# Patient Record
Sex: Female | Born: 1935 | Race: White | Hispanic: No | Marital: Married | Smoking: Never smoker
Health system: Southern US, Community
[De-identification: ages and names within clinical notes are randomized; demographics above are authoritative.]

## PROBLEM LIST (undated history)

## (undated) DIAGNOSIS — C4490 Unspecified malignant neoplasm of skin, unspecified: Secondary | ICD-10-CM

## (undated) DIAGNOSIS — F0390 Unspecified dementia without behavioral disturbance: Secondary | ICD-10-CM

## (undated) DIAGNOSIS — I82409 Acute embolism and thrombosis of unspecified deep veins of unspecified lower extremity: Secondary | ICD-10-CM

## (undated) HISTORY — PX: ABDOMINAL HYSTERECTOMY: SHX81

## (undated) HISTORY — PX: APPENDECTOMY: SHX54

---

## 1997-11-30 ENCOUNTER — Ambulatory Visit (HOSPITAL_COMMUNITY): Admission: RE | Admit: 1997-11-30 | Discharge: 1997-11-30 | Payer: Self-pay | Admitting: *Deleted

## 1997-12-20 ENCOUNTER — Ambulatory Visit (HOSPITAL_COMMUNITY): Admission: RE | Admit: 1997-12-20 | Discharge: 1997-12-20 | Payer: Self-pay | Admitting: Internal Medicine

## 1999-01-29 ENCOUNTER — Ambulatory Visit (HOSPITAL_COMMUNITY): Admission: RE | Admit: 1999-01-29 | Discharge: 1999-01-29 | Payer: Self-pay | Admitting: Internal Medicine

## 1999-01-29 ENCOUNTER — Encounter: Payer: Self-pay | Admitting: Internal Medicine

## 1999-08-19 ENCOUNTER — Encounter (INDEPENDENT_AMBULATORY_CARE_PROVIDER_SITE_OTHER): Payer: Self-pay | Admitting: *Deleted

## 1999-08-19 ENCOUNTER — Encounter: Payer: Self-pay | Admitting: Emergency Medicine

## 1999-08-19 ENCOUNTER — Inpatient Hospital Stay (HOSPITAL_COMMUNITY): Admission: EM | Admit: 1999-08-19 | Discharge: 1999-08-21 | Payer: Self-pay | Admitting: Emergency Medicine

## 1999-09-16 ENCOUNTER — Ambulatory Visit (HOSPITAL_COMMUNITY): Admission: RE | Admit: 1999-09-16 | Discharge: 1999-09-16 | Payer: Self-pay | Admitting: Ophthalmology

## 2000-02-09 ENCOUNTER — Encounter: Payer: Self-pay | Admitting: Internal Medicine

## 2000-02-09 ENCOUNTER — Ambulatory Visit (HOSPITAL_COMMUNITY): Admission: RE | Admit: 2000-02-09 | Discharge: 2000-02-09 | Payer: Self-pay | Admitting: Internal Medicine

## 2001-02-28 ENCOUNTER — Encounter: Payer: Self-pay | Admitting: Internal Medicine

## 2001-02-28 ENCOUNTER — Ambulatory Visit (HOSPITAL_COMMUNITY): Admission: RE | Admit: 2001-02-28 | Discharge: 2001-02-28 | Payer: Self-pay | Admitting: Internal Medicine

## 2002-03-08 ENCOUNTER — Encounter: Payer: Self-pay | Admitting: Internal Medicine

## 2002-03-08 ENCOUNTER — Ambulatory Visit (HOSPITAL_COMMUNITY): Admission: RE | Admit: 2002-03-08 | Discharge: 2002-03-08 | Payer: Self-pay | Admitting: Internal Medicine

## 2003-03-14 ENCOUNTER — Ambulatory Visit (HOSPITAL_COMMUNITY): Admission: RE | Admit: 2003-03-14 | Discharge: 2003-03-14 | Payer: Self-pay | Admitting: Internal Medicine

## 2003-03-14 ENCOUNTER — Encounter: Payer: Self-pay | Admitting: Internal Medicine

## 2003-10-17 ENCOUNTER — Ambulatory Visit: Admission: RE | Admit: 2003-10-17 | Discharge: 2003-10-17 | Payer: Self-pay | Admitting: Ophthalmology

## 2003-11-06 ENCOUNTER — Ambulatory Visit (HOSPITAL_COMMUNITY): Admission: RE | Admit: 2003-11-06 | Discharge: 2003-11-06 | Payer: Self-pay | Admitting: Ophthalmology

## 2004-03-25 ENCOUNTER — Ambulatory Visit (HOSPITAL_COMMUNITY): Admission: RE | Admit: 2004-03-25 | Discharge: 2004-03-25 | Payer: Self-pay | Admitting: Internal Medicine

## 2005-04-01 ENCOUNTER — Ambulatory Visit (HOSPITAL_COMMUNITY): Admission: RE | Admit: 2005-04-01 | Discharge: 2005-04-01 | Payer: Self-pay | Admitting: Internal Medicine

## 2006-04-07 ENCOUNTER — Ambulatory Visit (HOSPITAL_COMMUNITY): Admission: RE | Admit: 2006-04-07 | Discharge: 2006-04-07 | Payer: Self-pay | Admitting: Internal Medicine

## 2007-04-12 ENCOUNTER — Ambulatory Visit (HOSPITAL_COMMUNITY): Admission: RE | Admit: 2007-04-12 | Discharge: 2007-04-12 | Payer: Self-pay | Admitting: Internal Medicine

## 2008-04-25 ENCOUNTER — Ambulatory Visit (HOSPITAL_COMMUNITY): Admission: RE | Admit: 2008-04-25 | Discharge: 2008-04-25 | Payer: Self-pay | Admitting: Internal Medicine

## 2009-12-04 ENCOUNTER — Observation Stay (HOSPITAL_COMMUNITY): Admission: EM | Admit: 2009-12-04 | Discharge: 2009-12-05 | Payer: Self-pay | Admitting: Emergency Medicine

## 2009-12-05 ENCOUNTER — Encounter (INDEPENDENT_AMBULATORY_CARE_PROVIDER_SITE_OTHER): Payer: Self-pay | Admitting: Emergency Medicine

## 2009-12-05 ENCOUNTER — Ambulatory Visit: Payer: Self-pay | Admitting: Vascular Surgery

## 2010-03-20 ENCOUNTER — Encounter: Admission: RE | Admit: 2010-03-20 | Discharge: 2010-03-20 | Payer: Self-pay | Admitting: Internal Medicine

## 2010-09-08 LAB — GLUCOSE, CAPILLARY: Glucose-Capillary: 113 mg/dL — ABNORMAL HIGH (ref 70–99)

## 2010-09-08 LAB — CBC
Platelets: 226 10*3/uL (ref 150–400)
RBC: 4.58 MIL/uL (ref 3.87–5.11)
WBC: 7.3 10*3/uL (ref 4.0–10.5)

## 2010-09-08 LAB — DIFFERENTIAL
Basophils Absolute: 0.1 10*3/uL (ref 0.0–0.1)
Eosinophils Absolute: 0.1 10*3/uL (ref 0.0–0.7)
Eosinophils Relative: 1 % (ref 0–5)
Monocytes Absolute: 0.5 10*3/uL (ref 0.1–1.0)

## 2010-09-08 LAB — URINALYSIS, ROUTINE W REFLEX MICROSCOPIC
Bilirubin Urine: NEGATIVE
Glucose, UA: NEGATIVE mg/dL
Hgb urine dipstick: NEGATIVE
Protein, ur: NEGATIVE mg/dL
Specific Gravity, Urine: 1.011 (ref 1.005–1.030)
Urobilinogen, UA: 0.2 mg/dL (ref 0.0–1.0)

## 2010-09-08 LAB — COMPREHENSIVE METABOLIC PANEL
ALT: 13 U/L (ref 0–35)
AST: 19 U/L (ref 0–37)
Albumin: 3.7 g/dL (ref 3.5–5.2)
CO2: 33 mEq/L — ABNORMAL HIGH (ref 19–32)
Chloride: 111 mEq/L (ref 96–112)
GFR calc Af Amer: 59 mL/min — ABNORMAL LOW (ref >60)
GFR calc non Af Amer: 48 mL/min — ABNORMAL LOW (ref >60)
Potassium: 4.2 mEq/L (ref 3.5–5.1)
Sodium: 149 mEq/L — ABNORMAL HIGH (ref 135–145)
Total Bilirubin: 0.7 mg/dL (ref 0.3–1.2)

## 2010-09-08 LAB — APTT: aPTT: 29 seconds (ref 24–37)

## 2010-09-08 LAB — HEMOGLOBIN A1C
Hgb A1c MFr Bld: 5.7 % — ABNORMAL HIGH (ref <5.7)
Mean Plasma Glucose: 117 mg/dL — ABNORMAL HIGH (ref <117)

## 2010-09-08 LAB — LIPID PANEL
LDL Cholesterol: 147 mg/dL — ABNORMAL HIGH (ref 0–99)
Total CHOL/HDL Ratio: 4 RATIO
VLDL: 22 mg/dL (ref 0–40)

## 2010-09-08 LAB — PROTIME-INR: Prothrombin Time: 13.2 seconds (ref 11.6–15.2)

## 2010-09-08 LAB — CK TOTAL AND CKMB (NOT AT ARMC)
CK, MB: 1.8 ng/mL (ref 0.3–4.0)
Total CK: 116 U/L (ref 7–177)

## 2010-11-07 NOTE — H&P (Signed)
[place]. [facility]  Patient:    Jill Watkins, Jill Watkins                          MRN: [id number] Adm. Date:  [id number] Attending:  Vashti Hey                         History and Physical  DATE OF BIRTH:  16-Jul-1923.  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  Because of severity of pain and the fact that she was quite nauseated, she presented to the emergency room at 0200 hours and is evaluated by the emergency room staff.  The evaluation disclosed probable pancreatitis and she was subsequently referred to this examiner for further evaluation and subsequent admission.  The patient has no previous history of pancreatic disorder.  She has no previous history of gallbladder disorders.  She does not come to the office on a regular  basis.  Her last visit was July of 1998 and she was seen by one of our staff for complaints of nonspecific abdominal pain which was also radiating into the back. She had some loose stools, but not diarrhea on that presentation.  She had had bloating of the abdomen at that time.  The patient does have a history of nonbleeding peptic ulcer disease some years back, but no problems recently. She was also evaluated for severe epigastric pain in December of 1996 at which time she had "unbearable" epigastric pain radiating to her back and burning under the breasts.  She was prescribed some Carafate which helped quite a bit and she did not require further attention at that time.  She underwent an ultrasound of her abdomen in December of 1996 and at that time the gallbladder was well visualized without evidence of stones.  At that time  the common bile duct was normal measuring 3 mm. The liver, pancreas, and kidneys were all within normal limits.  The aorta was normal measuring up to 2 cm.  With the present illness, the patient denies any fever, chills, sweats, or actual vomiting.  She has had normal bowel movements up until this illness.  She has had no dysuria or hematuria.  PAST MEDICAL HISTORY:  Appendectomy at age 34.  Hysterectomy with retained ovaries at age 35.  She was hospitalized for ulcer disease about 40 years ago.  She also underwent excision of a melanoma on her left forearm about 40 years ago.  MEDICATIONS:  Citrucel for her bowels occasionally.  FAMILY HISTORY:  Both parents deceased at age 47.  Father of old age.  Mother had probable bowel obstruction.  She had had her gallbladder out prior to her death. She also had a total knee and total hip operation.  Her father had had a hip operation when he was living.  She had two brothers and two sisters, all are deceased.  One brother died at age 95 of cancer of the small intestines.  Other  brother died at age 40.  He had had a stroke, heart trouble, and was a diabetic. The brother deceased at age 83 had also had an MI at  age 59.  One sister died at age 39 of pneumonia.  Another sister died of seizures at age 35-1/2.  Maternal grandfather died of cancer of some sort.  The patient has one son and one daughter. She has two grandchildren by her son.  The grandchildren are ages 28 and 38.  REVIEW OF SYSTEMS:  HEENT: The patient does have cataracts and is due to have surgery on March 27, by Jill Watkins, M.D.  She had recently called to schedule a preoperative assessment, but never got to the office.  No complaints of ears or throat.  No significant headaches.  No loss of consciousness. CARDIOVASCULAR:  Denies any chest pain or shortness of breath.  No recent problems with any respiratory illnesses.  No history of heart disease.  She does have  a history of high cholesterol.  GASTROINTESTINAL: No regular problems with her bowels. No nausea and vomiting.  No history of colitis, pancreatitis, or gallbladder disease.  GENITOURINARY: She has had urinary tract infections in the past, but nothing recent.  No hematuria or dysuria.  ENDOCRINOLOGIC: No history of diabetes or thyroid disorders.  HEMATOLOGIC:  No bleeding or bruising tendencies. MUSCULOSKELETAL: Some stiffness in various joints and muscles, but nothing specific requiring regular treatment.  NEUROPSYCHIATRIC: Denies any unusual nervous conditions.  ALLERGIES:  CODEINE makes her nauseated and vomit.  ERYTHROMYCIN upsets her stomach.  SOCIAL HISTORY:  She is wife of Fayrene Fearing "Cindee [employer]ame[employer], a Printmaker. The patient does not use alcohol in any form.  She does not use tobacco in any form. She does household activities only.  PHYSICAL EXAMINATION:  VITAL SIGNS:  On presentation notes a temperature of 99.3 degrees orally, blood  pressure 136/88, pulse 72, respirations 20.  Follow-up blood pressures and pulses were similar.  HEENT:  Pupils minimally reactive to light and accommodation directly consentually. Extraocular muscles are intact.  No signs of scleral icteris.  Oral cavity notes the membranes to be quite dry.  There is a moderate fetor due to her lack of hydration.  Teeth are in reasonably good repair.  Membranes are otherwise negative. Ear canals are patent.  Skull with no signs of trauma.  NECK:  Supple.  No thyromegaly or adenopathy.  LUNGS:  Clear anterior and laterally.  HEART:  Regular rate and rhythm without murmurs, rubs, or gallops.  ABDOMEN:  Hypoactive bowel sounds.  There is diffuse tenderness especially in the periumbilical area and lower quadrants, right greater than left.  PELVIC: RECTAL:  Deferred at this time.  EXTREMITIES:  Show no edema, cyanosis, or clubbing.  Good range of motion. Pulses are intact.  NEUROLOGICAL:   Grossly intact.  No gross signs of cranial nerve deficits.  She moves all limbs well and has good muscle tone.   LABORATORY DATA:  Urinalysis shows specific gravity 1.014, otherwise negative. er comprehensive metabolic panel is unremarkable with normal liver functions and a BUN of 19, creatinine 0.8.  Her CBC shows elevated white count of 15,500, hemoglobin 13.5, hematocrit 41%, MCV 82, platelet count 260,000.  Her amylase level is normal at 120, upper normal of 131.  Her lipase is normal at 40, upper normal at 51.  CT scan of her abdomen with contrast media showed a mild enlargement and inhomogenedia of the pancreas particularly in the region of the head.  Findings are consistent with acute pancreatitis.  There is no evidence of gallstones or biliary ductal dilatation.  CT of the pelvis showed bladder to be mildly distended, but no  free fluid was seen.  No inguinal or iliac adenopathy.  There was mild changes f sigmoid diverticulosis.  IMPRESSION: 1. Acute abdominal pain with possible passage of gallstone and subsequent mild    pancreatitis, though, no evident clinically with elevated amylase or lipase. 2. Status post melanoma excision left forearm. 3. Status post hysterectomy. 4. Cataracts with upcoming eye surgery.  PLAN:  The patient is placed NPO at the present time and is placed on intravenous fluids.  She will have a two-hour urinary amylase to further assess the pancreatitis.  She will have follow-up CBC soon.  I have asked Sabino Gasser, M.D., gastroenterologist with [facility] to evaluate her for any further diagnostic procedures.  Condition at this time is fair.  Prognosis is fair to good. DD:  08/19/99 TD:  08/19/99 Job: [id number]

## 2010-11-07 NOTE — Discharge Summary (Signed)
[place]. [facility]  Patient:    Jill Watkins, Jill Watkins                          MRN: [id number] Adm. Date:  [id number] Disc. Date: [id number] Attending:  Vashti Hey Dictator:   Janae Bridgeman Eloise Harman., M.D.                           Discharge Summary  DISCHARGE DIAGNOSES: 1. Multiple duodenal ulcers. 2. Acute pancreatitis secondary to above.  HISTORY OF PRESENT ILLNESS:  This 77 year old married white female presented with acute severe abdominal and back pain, and was found to have acute pancreatitis n CT scanning of her abdomen.  She was subsequently admitted for further evaluation and treatment of pancreatitis with further diagnostic procedures entertained.  HOSPITAL COURSE:  The patient was seen by specialist, Dr. Sabino Gasser, for gastroenterology service.  He concurred with the indications for upper endoscopy to further define the abdominal pain.  The patient had serum enzymes for pancreatitis, and they were surprisingly normal.  She had a two hour urinary amylase which was normal.  Her hemoglobin was stable.  There was no evidence of bleeding.  Upper endoscopy by Dr. Virginia Rochester on 08/20/99 disclosed multiple duodenal ulcers.  CLOtesting was performed, but report is not available at this time.  The patient had rapid resolution of her symptoms following intravenous Pepcid, nd she was subsequently switched to Protonix.  She was kept n.p.o. for the first 48 hours.  LABORATORY DATA:  Her timed urinary amylase was 12.8 iu/hr with upper normal of  25.2.  Her triglycerides were normal at 53.  Her urine culture showed no growth. Her Helicobacter screen is pending.  Serum amylase normal at 73.  Comprehensive  metabolic panel was normal with a slightly elevated glucose of 119 with an IV running.  Total bilirubin was slightly up at 1.3.  Her lipase was low at 21. Her CBC showed initial white count of 15,500, and then later in the same day it  was  16,300, and subsequently down to 11,000.  Hemoglobin 12.4, hematocrit 38% on specimen of 08/20/99.  Her lipid profile showed a cholesterol of 191, triglycerides 87, HDL cholesterol 62, LDL of 112.  Her three-way abdomen was unremarkable. There were slight increase marking in the lungs bilaterally, moderate amount of feces  noted.  A CT scan of the abdomen was verbally reported as showing pancreatitis ith edema of the head of the pancreas.  No other abnormalities were seen.  PROCEDURES:  Endoscopy with biopsy by Dr. Sabino Gasser.  TRANSFUSIONS:  None.  INJECTIONS:  None.  RECOMMENDATIONS:  On discharge the patient is advised that she had duodenal ulcers and this could well be due to a germ called Helicobacter.  She was to take Protonix 40 mg b.i.d. for the first 2 weeks, then 40 mg daily x 2 additional weeks. Pending the results of her biopsy, she will likely need some amoxicillin 500 mg t.i.d. before meals for two full weeks, but she will hold this prescription for now. he will avoid any kind of aspirin or ibuprofen products.  She will see me in the office in one month, but call for any additional pain.  She may proceed with her cataract surgery scheduled for later this month by Dr. Stormy Fabian.  CONDITION ON DISCHARGE:  Greatly improved. DD:  08/21/99 TD:  08/21/99 Job: [id number]

## 2010-11-07 NOTE — H&P (Signed)
NAME:  Jill Watkins, Jill Watkins                             ACCOUNT NO.:  [id number]   MEDICAL RECORD NO.:  [id number]                   PATIENT TYPE:  OIB   LOCATION:  2899                                 FACILITY:  [facility]   PHYSICIAN:  Guadelupe Sabin, M.D.             DATE OF BIRTH:  Nov 17, 1923   DATE OF ADMISSION:  11/06/2003  DATE OF DISCHARGE:                                HISTORY & PHYSICAL   This was a planned outpatient surgical readmission of this 77 year old white  female admitted for cataract implant surgery of the right eye.   HISTORY OF PRESENT ILLNESS:  This patient has a long history of eye trouble  dating back to May 30, 1996, when she had a retinal detachment.  She was  admitted to this hospital and had a scleral buckling procedure performed.  On August 22, 1996, a pars plana vitrectomy was performed on the same left  eye.  More recently, the patient has undergone cataract implant surgery of  the left eye on September 16, 1999.  The patient has done well following this  surgical procedure on the left eye with return of vision to 20/60.  The  unoperated right eye has deteriorated to 20/16, and the patient has noted  blurred, smoky, and dim vision; difficulty with driving; seeing road signs;  difficulty with television; reading; night vision; and bright sunlight  vision.  She has had difficulty seeing housework and trouble with depth  perception difficulties. She has signed an informed consent and arrangements  made for outpatient admission at this time.   PAST MEDICAL HISTORY:  The patient is in stable general health under the  care of Dr. Dimas Alexandria.  She is felt to be in satisfactory condition  for the proposed surgery.   MEDICATIONS:  The patient takes calcium and Tylenol medications.   ALLERGIES:  She is said to be allergic to CODEINE and ASPIRIN.   PHYSICAL EXAMINATION:  VITAL SIGNS:  As recorded on admission, blood  pressure 132/82, pulse 84, respirations 18.  GENERAL:  The patient is a pleasant, well-nourished, well-developed white  female in no acute distress.  HEENT:  Eyes:  On ocular exam, the eyes are white and clear with a clear  cornea, deep and clear anterior chamber.  Nuclear cataract formation is  present in the right eye and a posterior chamber intraocular lens implant in  the left eye.  Applanation tonometry 18 mm each eye.  Detailed fundus  examination dilated reveals cataract haze in the right eye.  The retina is  attached with normal optic nerve, blood vessels, and macula.  The left eye  shows a clear view with reattached retina with a temporal scleral buckling  indentation.  The optic nerve is sharply outlined, of good color, disk/cup  ratio 0.4.  CHEST:  Lungs clear to percussion and auscultation.  HEART:  Normal sinus rhythm.  No cardiomegaly, no  murmurs.  ABDOMEN:  Negative.  EXTREMITIES:  Negative.   ADMISSION DIAGNOSES:  1. Senile nuclear cataract, right eye.  2. Pseudophakia, left eye.   SURGICAL PLAN:  Cataract implant surgery, right eye.                                                Guadelupe Sabin, M.D.    HNJ/MEDQ  D:  11/06/2003  T:  11/06/2003  Job:  [id number]   cc:   Marisa Cyphers, M.D  Please include associated laboratory  data, EKG, or chest x-ray material.

## 2010-11-07 NOTE — H&P (Signed)
[place]. [facility]  Patient:    Jill Watkins, Jill Watkins                          MRN: [id number] Adm. Date:  [id number] Attending:  Ivor Messier CC:         Janae Bridgeman. Eloise Harman., M.D.                         History and Physical  CHIEF COMPLAINT:  This was a planned outpatient surgical admission of this 77 year old white female, admitted for cataract implant surgery of the left eye.  HISTORY OF PRESENT ILLNESS: This patient has been followed in my office since 1997 when she was noted to have a retinal detachment of the left eye.  A scleral buckling procedure was performed with intravitreal.  A secondary vitrectomy was  performed on August 22, 1996.  The patient achieved retinal reattachment and return of good vision in the eye after healing.  Recently, however, the 20/30 vision has deteriorated to 20/200 due to progressive cataract formation.  She has now elected to proceed with cataract implant surgery of the left eye.  The patient signed an informed consent and arrangements were made for her outpatient admission at this time.  PAST MEDICAL HISTORY: The patient is in stable general health, under the care of her regular physician, Dr. Higinio Plan.  MEDICATIONS:  The patient takes medication for stomach ulcers.  REVIEW OF SYSTEMS:  No cardiorespiratory complaints.  PHYSICAL EXAMINATION:  GENERAL:  The patient is a pleasant well-developed, well-nourished white female in no acute ocular distress.  HEENT:  Eyes: Visual acuity recently recorded with best correction at 20/50 right eye, 20/100 left eye.  Applanation tenometry normal; right eye 14 mm, left eye 4 mm.  Slit lamp examination revealed the eyes are white and clear with nuclear cataract formation in both eyes, greater in the left than right eye.  Fundus examination reveals a clear vitreous, attached retina.  An old scleral buckling  indentation is seen on the temporal side of the  left eye.  The optic nerve is sharply outlined.  Good color.  Disc cup ratio approximately 0.3.  CHEST:  Lungs clear to auscultation and percussion.  HEART:  Normal sinus rhythm, no cardiomegaly, no murmurs.  ABDOMEN:  Negative.  EXTREMITIES:  Negative.  ADMISSION DIAGNOSIS: Senile cataract, left eye, status post previous scleral buckling vitrectomy surgery left eye.  SURGICAL PLAN:  Planned extracapsular cataract extraction with primary insertion of posterior chamber intraocular lens implant, left eye. DD:  09/16/99 TD:  09/16/99 Job: [id number]

## 2010-11-07 NOTE — Op Note (Signed)
NAME:  Jill Watkins, Jill Watkins                             ACCOUNT NO.:  [id number]   MEDICAL RECORD NO.:  [id number]                   PATIENT TYPE:  OIB   LOCATION:  2899                                 FACILITY:  [facility]   PHYSICIAN:  Guadelupe Sabin, M.D.             DATE OF BIRTH:  1923/10/30   DATE OF PROCEDURE:  11/06/2003  DATE OF DISCHARGE:                                 OPERATIVE REPORT   PREOPERATIVE DIAGNOSIS:  Senile nuclear cataract, right eye.   POSTOPERATIVE DIAGNOSES:  Senile nuclear cataract, right eye.   OPERATION:  Planned extracapsular cataract extraction --  phacoemulsification, primary insertion of posterior chamber intraocular lens  implant.   SURGEON:  Guadelupe Sabin, M.D.   ASSISTANT:  Nurse.   ANESTHESIA:  Local 4% Xylocaine, 0.75 Marcaine, retrobulbar block with  Wydase added, topical tetracaine, intraocular Xylocaine.  Anesthesia standby  required.  Patient given sodium Pentothal or Ditropan intravenously during  the period of retrobulbar injection.   DESCRIPTION OF PROCEDURE:  After the patient was prepped and draped, a lid  speculum was inserted in the right eye.  The eye was turned downward and a  superior rectus suture placed.  A peritomy was performed adjacent to the  limbus from the 11 to 1 o'clock position.  The corneoscleral junction was  cleaned and a corneoscleral groove made with a 45 degree Superblade.  The  anterior chamber was then entered with the 2.5 mm diamond keratome at the 12  o'clock position and the 15 degree blade at the 2:30 position.  Using a bent  26-gauge needle on a Occucoat syringe, a circular capsulorhexis was begun  and then completed with the Grabow forceps.  Hydrodissection and  hydrodelineation were performed using 1% Xylocaine.  The 30-degree  phacoemulsification tip was inserted with slow, controlled emulsification  with lens nucleus.  Total ultrasonic time: 1 minute 2 seconds.  Average  power level: 16%.  Total amount  of fluid used: 50 ml.  Following removal of  the nucleus, the residual cortex was aspirated with the irrigation  aspiration tip.  The posterior capsule appeared intact.  It was, therefore,  elected to insert an [employer] Opt[employer] [id number] silicone three-piece  posterior chamber implant, diopter strength +25.00.  This was inserted with  the McDonald forceps into the anterior chamber.  It was realized, however,  that the haptic loop was broken, and the implant was then removed after the  incision had been extended to approximately 6 mm.  A new similar implant was  then inserted into the anterior chamber and then centered into the capsular  bag.  The operative incision, which  had been extended to 6 mm, was then closed with three 10-0 interrupted nylon  sutures.  Maxitrol ointment was instilled in the conjunctival cul-de-sac and  a  light patch applied.  Duration of procedure: 45 minutes.  The patient  tolerated the procedure  well in general and left the operating room for the  recovery room in good condition.                                               Guadelupe Sabin, M.D.    HNJ/MEDQ  D:  11/06/2003  T:  11/06/2003  Job:  [id number]   cc:   Janae Bridgeman. Eloise Harman., M.D.  [address]  [place]  [place] [address]  Fax: [phone]

## 2010-11-07 NOTE — Procedures (Signed)
[place]. [facility]  Patient:    Jill Watkins, Jill Watkins                          MRN: [id number] Adm. Date:  [id number] Attending:  Vashti Hey                           Procedure Report  PROCEDURE:  Endoscopy with biopsy.  ENDOSCOPIST:  Sabino Gasser, M.D.  INDICATIONS:  Abdominal pain.  ANESTHESIA:  Demerol 30 mg and Versed 4 mg were given intravenously in divided dose.  DESCRIPTION OF PROCEDURE:  With patient mildly sedated in the left lateral decubitus position, the Olympus videoscopic endoscope was inserted into the mouth and passed under direct vision through the esophagus, which appeared normal.  A  small hiatal hernia was visualized and photographed.  We entered into the stomach. The antrum was visualized and appeared normal.  As we went through the duodenal  bulb, anteriorly it appeared normal, as did the second portion of duodenum.  As we pulled back, taking circumferential views of the entire duodenal mucosa, we noted an ulceration and subsequently noticed that there were a number of small ulcers  posteriorly and superiorly in the duodenal bulb with attendant edema which was fairly marked and fairly localized.  We biopsied this after photographing it and subsequently pulled the endoscope back into the stomach and took a CLO biopsy. The antrum appeared slightly erythematous.  We placed the endoscope in retroflexion to view the stomach from below and the hiatal hernia was once again visualized and  photographed.  The endoscope was then straightened and pulled back from distal-to-proximal stomach, taking circumferential views of the entire gastric nd subsequently the esophageal mucosa which otherwise appeared normal.  Patients vital signs and pulse oximetry remained stable.  Patient tolerated procedure well without apparent complications.  FINDINGS:  Hiatal hernia; posterior duodenal bulb ulcerations, biopsied, CLO biopsy taken  as well.  IMPRESSION:  Posterior duodenal ulcers in the bulb which I think would account or the patients right upper quadrant tenderness and for the mild inflammatory changes seen in the head of the pancreas, with normal amylase and lipase.  At this point, will advance diet as tolerated, change patient to proton pump inhibitor therapy, await biopsy reports to institute Helicobacter pylori therapy if positive. DD:  08/20/99 TD:  08/20/99 Job: [id number]

## 2010-11-07 NOTE — Op Note (Signed)
[place]. [facility]  Patient:    Jill Watkins, Jill Watkins                          MRN: [id number] Proc. Date: 09/16/99 Adm. Date:  [id number] Attending:  Ivor Messier                           Operative Report  PREOPERATIVE DIAGNOSIS: Senile cataract, left eye.  POSTOPERATIVE DIAGNOSIS: Senile cataract, left eye.  OPERATION/PROCEDURE: Planned extracapsular cataract extraction, phacoemulsification, primary insertion of posterior chamber intraocular lens implant.  SURGEON:  Guadelupe Sabin, M.D.  ASSISTANT:  Nurse.  ANESTHESIA: Local 4% xylocaine, 0.75% Marcaine retrobulbar block, topical tetracaine, intraocular xylocaine.  DESCRIPTION OF PROCEDURE: After the patient was prepped and draped a speculum was inserted in the left eye.  The eye was turned downward and a superior rectus traction suture placed.  Schiotz tenometry was recorded at 7 scale units with a 5.5 gram weight.  A peritomy was performed adjacent to the limbus from the 11 to 1 oclock position.  The corneoscleral junction was cleaned and a corneoscleral groove made with a 45 degree Superblade.  The anterior chamber was then entered  with a 2.5 mm diamond keratome at the 12 oclock position and a 15 degree blade t the 2:30 oclock position.  Using a bent 26 gauge needle on a Healon syringe a circular capsulorrhexis was begun and then completed with the Grabo forceps. Hydrodissection and hydrodelineation were performed using 1% xylocaine.  The 30  degree phacoemulsification tip was then inserted with slow emulsification of the lens nucleus.  Total ultrasonic time was one minute and 12 seconds, average power level 16%, total amount of fluid used during the emulsification 45 cc. Following removal of the nucleus the residual cortex was aspirated with the 0.3 mm irrigation-aspiration tip.  The posterior capsule appeared intact with a brilliant red fundus reflex.  It was therefore elected to  insert an [id number] silicone posterior chamber intraocular lens implant with UV absorber.  Diopter strength +22.00. This was inserted with the McDonald forceps and then placed in the capsular bag using the Lister lens rotator.  The lens appeared to be well centered.  The Healon which had been used during the procedure was then aspirated and replaced with balanced salt solution with Miochol ophthalmic solution to stimulate pupillary constriction. The operative incision was tested and found to be self-sealing.  No sutures were required.  The conjunctiva was brushed forward over the operative incision and Eserine ophthalmic ointment was instilled in the conjunctival cul-de-sac, and a  light patch and protective shield applied.  Duration of procedure and anesthesia administration 45 minutes.  The patient tolerated the procedure well in general and left the operating room for the recovery room in good condition. DD:  09/16/99 TD:  09/16/99 Job: [id number]

## 2011-05-30 ENCOUNTER — Emergency Department (HOSPITAL_COMMUNITY)
Admission: EM | Admit: 2011-05-30 | Discharge: 2011-05-30 | Discharge status: Against Medical Advice | Payer: Medicare Other | Attending: Emergency Medicine | Admitting: Emergency Medicine

## 2011-05-30 ENCOUNTER — Encounter: Payer: Self-pay | Admitting: Emergency Medicine

## 2011-05-30 DIAGNOSIS — S8990XA Unspecified injury of unspecified lower leg, initial encounter: Secondary | ICD-10-CM | POA: Insufficient documentation

## 2011-05-30 DIAGNOSIS — W1789XA Other fall from one level to another, initial encounter: Secondary | ICD-10-CM | POA: Insufficient documentation

## 2011-05-30 DIAGNOSIS — Z5320 Procedure and treatment not carried out because of patient's decision for unspecified reasons: Secondary | ICD-10-CM | POA: Insufficient documentation

## 2011-05-30 HISTORY — DX: Unspecified malignant neoplasm of skin, unspecified: C44.90

## 2011-05-30 NOTE — ED Notes (Signed)
Pt reports fall today down front steps. Pt hit right leg on side of steps. Pt has lac to right leg and swelling noted.

## 2011-06-22 ENCOUNTER — Encounter (HOSPITAL_COMMUNITY): Payer: Self-pay | Admitting: Nurse Practitioner

## 2011-06-22 ENCOUNTER — Emergency Department (HOSPITAL_COMMUNITY)
Admission: EM | Admit: 2011-06-22 | Discharge: 2011-06-22 | Discharge status: Against Medical Advice | Payer: Medicare Other | Attending: Emergency Medicine | Admitting: Emergency Medicine

## 2011-06-22 DIAGNOSIS — M79609 Pain in unspecified limb: Secondary | ICD-10-CM | POA: Insufficient documentation

## 2011-06-22 HISTORY — DX: Unspecified dementia, unspecified severity, without behavioral disturbance, psychotic disturbance, mood disturbance, and anxiety: F03.90

## 2011-06-22 HISTORY — DX: Acute embolism and thrombosis of unspecified deep veins of unspecified lower extremity: I82.409

## 2011-06-22 NOTE — ED Notes (Signed)
Pt fell approx 3 weeks ago and injured RLE. After the fall developed infection in RLE at site of injury, started on cephalexin by PCP but d/t pt dementia she did not complete abx or take them correctly and daughter feels infection remains. Daughter states xrays were completed at initial visit and no broken bones. Pt c/o R calf pain and pain at site. Scab with swelling and bruising to R anterior calf

## 2012-08-26 ENCOUNTER — Encounter (HOSPITAL_COMMUNITY): Payer: Self-pay | Admitting: *Deleted

## 2012-08-26 ENCOUNTER — Emergency Department (HOSPITAL_COMMUNITY)
Admission: EM | Admit: 2012-08-26 | Discharge: 2012-08-26 | Disposition: A | Discharge status: Home / Self Care | Payer: Medicare Other | Attending: Emergency Medicine | Admitting: Emergency Medicine

## 2012-08-26 DIAGNOSIS — G309 Alzheimer's disease, unspecified: Secondary | ICD-10-CM | POA: Insufficient documentation

## 2012-08-26 DIAGNOSIS — IMO0001 Reserved for inherently not codable concepts without codable children: Secondary | ICD-10-CM | POA: Insufficient documentation

## 2012-08-26 DIAGNOSIS — Z86718 Personal history of other venous thrombosis and embolism: Secondary | ICD-10-CM | POA: Insufficient documentation

## 2012-08-26 DIAGNOSIS — Z79899 Other long term (current) drug therapy: Secondary | ICD-10-CM | POA: Insufficient documentation

## 2012-08-26 DIAGNOSIS — F0280 Dementia in other diseases classified elsewhere without behavioral disturbance: Secondary | ICD-10-CM | POA: Insufficient documentation

## 2012-08-26 DIAGNOSIS — Z85828 Personal history of other malignant neoplasm of skin: Secondary | ICD-10-CM | POA: Insufficient documentation

## 2012-08-26 DIAGNOSIS — M791 Myalgia, unspecified site: Secondary | ICD-10-CM

## 2012-08-26 DIAGNOSIS — R5381 Other malaise: Secondary | ICD-10-CM | POA: Insufficient documentation

## 2012-08-26 DIAGNOSIS — R7989 Other specified abnormal findings of blood chemistry: Secondary | ICD-10-CM | POA: Insufficient documentation

## 2012-08-26 DIAGNOSIS — R51 Headache: Secondary | ICD-10-CM | POA: Insufficient documentation

## 2012-08-26 LAB — URINALYSIS, ROUTINE W REFLEX MICROSCOPIC
Bilirubin Urine: NEGATIVE
Nitrite: NEGATIVE
Specific Gravity, Urine: 1.016 (ref 1.005–1.030)
Urobilinogen, UA: 1 mg/dL (ref 0.0–1.0)
pH: 6.5 (ref 5.0–8.0)

## 2012-08-26 LAB — URINE MICROSCOPIC-ADD ON

## 2012-08-26 LAB — POCT I-STAT, CHEM 8
Calcium, Ion: 1.16 mmol/L (ref 1.13–1.30)
Creatinine, Ser: 1.2 mg/dL — ABNORMAL HIGH (ref 0.50–1.10)
Hemoglobin: 13.6 g/dL (ref 12.0–15.0)
Sodium: 145 mEq/L (ref 135–145)
TCO2: 31 mmol/L (ref 0–100)

## 2012-08-26 NOTE — ED Notes (Signed)
EKG completed and given to Dr. Bednar along with OLD ekg. 

## 2012-08-26 NOTE — ED Provider Notes (Signed)
History     CSN: [id number]  Arrival date & time 08/26/12  1040   None     Chief Complaint  Patient presents with  . Headache  . Muscle Pain    all over body aches    (Consider location/radiation/quality/duration/timing/severity/associated sxs/prior treatment) Patient is a 79 y.o. female presenting with headaches and musculoskeletal pain.  Headache Associated symptoms: no abdominal pain, no cough, no diarrhea, no fever and no vomiting   Muscle Pain Associated symptoms include headaches. Pertinent negatives include no abdominal pain, chest pain, chills, coughing, fever, vomiting or weakness.   Ms. Joa 78yo woman pmh [place] dementia presented to ED with son and husband (who is primary caregiver) since pt described feeling "bad all over." Pt is able to participate in conversation and states not in active pain at this time. Family states pt has not had any fever/chills, nausea/vomiting/diarrhea, cough or SOB. No other sick contacts. Pt ambulates at home and is able to participate in ADLs. Primary caregiver states has been taking all medication and pt usually a little more subdued after "anxiety pills." Family members denied any slurred speech, new weakness, LOC, no facial assymmetry.    Past Medical History  Diagnosis Date  . Skin cancer   . Dementia   . DVT (deep venous thrombosis)     Past Surgical History  Procedure Laterality Date  . Appendectomy    . Abdominal hysterectomy      History reviewed. No pertinent family history.  History  Substance Use Topics  . Smoking status: Never Smoker   . Smokeless tobacco: Not on file  . Alcohol Use: No    OB History   Grav Para Term Preterm Abortions TAB SAB Ect Mult Living                  Review of Systems  Constitutional: Negative for fever, chills, activity change and appetite change.  Respiratory: Negative for cough and shortness of breath.   Cardiovascular: Negative for chest pain.  Gastrointestinal: Negative for  vomiting, abdominal pain, diarrhea and constipation.  Genitourinary: Negative for difficulty urinating.  Neurological: Positive for headaches. Negative for syncope, facial asymmetry, speech difficulty and weakness.    Allergies  Codeine  Home Medications   Current Outpatient Rx  Name  Route  Sig  Dispense  Refill  . diphenhydramine-acetaminophen (TYLENOL PM) 25-500 MG TABS   Oral   Take 1 tablet by mouth at bedtime as needed.         . divalproex (DEPAKOTE) 250 MG DR tablet   Oral   Take 250 mg by mouth 3 (three) times daily.         Marland Kitchen LORazepam (ATIVAN) 1 MG tablet   Oral   Take 1 mg by mouth daily.         . Memantine HCl ER (NAMENDA XR) 28 MG CP24   Oral   Take 1 capsule by mouth daily.         . valsartan-hydrochlorothiazide (DIOVAN-HCT) 160-25 MG per tablet   Oral   Take 1 tablet by mouth daily. Take one-half tab to one tablet once a day.         . cephALEXin (KEFLEX) 500 MG capsule   Oral   Take 500 mg by mouth every 6 (six) hours.             BP 116/94  Temp(Src) 97.5 F (36.4 C) (Oral)  Resp 16  SpO2 95%  Physical Exam  Constitutional: She appears well-developed  and well-nourished. No distress.  HENT:  Head: Normocephalic.  Eyes: EOM are normal. Pupils are equal, round, and reactive to light.  Cardiovascular: Normal rate and regular rhythm.  Exam reveals no gallop.   No murmur heard. Pulmonary/Chest: Effort normal and breath sounds normal. She has no wheezes. She has no rales.  Abdominal: Soft. Bowel sounds are normal. She exhibits no distension and no mass. There is no tenderness. There is no rebound and no guarding.  Neurological: She is alert. No cranial nerve deficit.  Oriented to self and place  Skin: Skin is warm.  Psychiatric:  Appropriate thought content, decreased judgement, and intermittent short term memory loss    ED Course  Procedures (including critical care time)  Labs Reviewed  POCT I-STAT, CHEM 8 - Abnormal; Notable  for the following:    BUN 26 (*)    Creatinine, Ser 1.20 (*)    All other components within normal limits  URINALYSIS, ROUTINE W REFLEX MICROSCOPIC   No results found.   No diagnosis found.    MDM  Overall weakness: Pt had no localizing neurological findings and pt had no other signs of being in pain (tachycardia, HTN, etc) or other localizing physical exam findings. Labs revealed slight prerenal azotemia, EKG NSR, and UA was still pending.   Pt was seen and discussed with Dr. Linna Darner, MD 08/26/12 1153

## 2012-08-26 NOTE — ED Provider Notes (Signed)
I saw and evaluated the patient, reviewed the resident's note and I agree with the findings and plan.   .Face to face Exam:  General:  Awake HEENT:  Atraumatic Resp:  Normal effort Abd:  Nondistended Neuro:No focal weakness Lymph: No adenopathy   Nelia Shi, MD 08/26/12 1356

## 2012-08-26 NOTE — ED Notes (Signed)
Pt presents with report of generalized pain that husband reports pt began to complain about this morning.  Pt denies any pain at present.  Husband reports pt's mental status is at baseline.

## 2012-08-26 NOTE — ED Notes (Signed)
Pt found, fully clothed with coat on, walking down the hall.  Husband and son remained in her room, pt reports "I'm ready to go, I've been here all day".  Pt gently escorted back to her room and informed that the wait is for her urine to result.

## 2012-08-26 NOTE — ED Notes (Signed)
Son reports pt is pulling off blood pressure cuff and EKG wires.  Husband reports he can take pt to bathroom for urine specimen.  Clean catch instructions given.

## 2012-08-26 NOTE — ED Notes (Signed)
To ED for eval of HA and 'hurting all over' since waking this am. No vomiting. No trauma noted.

## 2012-08-26 NOTE — ED Notes (Signed)
Pt walking down E.D. Hallways with son, insisting on leaving.  Pt escorted back to room, discharge instructions given to husband.

## 2013-01-08 ENCOUNTER — Ambulatory Visit (HOSPITAL_COMMUNITY)
Admission: RE | Admit: 2013-01-08 | Discharge: 2013-01-08 | Disposition: A | Discharge status: Home / Self Care | Payer: Medicare Other | Source: Ambulatory Visit | Attending: Family Medicine | Admitting: Family Medicine

## 2013-01-08 ENCOUNTER — Ambulatory Visit (HOSPITAL_COMMUNITY): Admission: RE | Admit: 2013-01-08 | Payer: Medicare Other | Source: Ambulatory Visit

## 2013-01-08 ENCOUNTER — Ambulatory Visit (INDEPENDENT_AMBULATORY_CARE_PROVIDER_SITE_OTHER): Payer: Medicare Other | Admitting: Family Medicine

## 2013-01-08 ENCOUNTER — Ambulatory Visit: Payer: Medicare Other

## 2013-01-08 ENCOUNTER — Other Ambulatory Visit (HOSPITAL_COMMUNITY): Payer: Medicare Other

## 2013-01-08 VITALS — BP 120/70 | HR 79 | Temp 98.0°F | Resp 16 | Ht 63.0 in | Wt 155.0 lb

## 2013-01-08 DIAGNOSIS — G309 Alzheimer's disease, unspecified: Secondary | ICD-10-CM

## 2013-01-08 DIAGNOSIS — F0280 Dementia in other diseases classified elsewhere without behavioral disturbance: Secondary | ICD-10-CM

## 2013-01-08 DIAGNOSIS — I10 Essential (primary) hypertension: Secondary | ICD-10-CM

## 2013-01-08 DIAGNOSIS — M1611 Unilateral primary osteoarthritis, right hip: Secondary | ICD-10-CM

## 2013-01-08 DIAGNOSIS — M25551 Pain in right hip: Secondary | ICD-10-CM

## 2013-01-08 DIAGNOSIS — M25559 Pain in unspecified hip: Secondary | ICD-10-CM

## 2013-01-08 DIAGNOSIS — M169 Osteoarthritis of hip, unspecified: Secondary | ICD-10-CM

## 2013-01-08 MED ORDER — PREDNISONE 20 MG PO TABS: ORAL_TABLET | ORAL | Status: DC

## 2013-01-08 NOTE — Progress Notes (Signed)
This is a pleasant 79 year old woman who comes in with right hip pain. She lives with her husband, and her daughter from out of town brought her by this morning.  History is that the patient was unplugging her dryer 3 nights ago to turn it off. She fell and had pain in her right hip. The pain seemed to improve at first but over the last 24 hours his gotten worse. Her gait is markedly different and she walks stifflegged. Patient is asking for a shot of cortisone so she can leave.  Objective: No acute distress, patient examined while supine. There is no external rotation of the right lower extremity. Patient is somewhat vague about her lying what kind of symptoms she has my maneuver her lower extremities. Straight-leg raising is negative Internal and external rotation causes pain in different parts of her right lower extremity including the right hip and right ankle. She says the pain is more in the groin with external rotation.  Palpation of the abdomen is normal Palpation of the right groin is extremely painful.  [facility] reading (PRIMARY) by  Dr. Milus Glazier:  Right hip. No fracture seen  Assessment:  I'm concerned he may be a subtle or cold fracture in the right hip. The x-rays we took care not of the best quality so would like to have the patient get a CAT scan over at [facility] to better rule out fracture. Once this is done, providing there is no fracture, we can call in some prednisone for 3 days to stop the inflammation is certainly there  CT of right hip:  Negative  Plan: Hip pain, right - Plan: DG Hip Complete Right, CT Hip Right Wo Contrast, predniSONE (DELTASONE) 20 MG tablet, CANCELED: CT Abdomen Pelvis Wo Contrast, CANCELED: CT EXTREM LOWER WO CM BIL  Alzheimer's disease  Hypertension  Osteoarthritis of right hip - Plan: CT Hip Right Wo Contrast, CANCELED: CT Pelvis Wo Contrast, CANCELED: CT Abdomen Pelvis Wo Contrast, CANCELED: CT EXTREM LOWER WO CM BIL  Elvina Sidle, MD

## 2013-01-14 ENCOUNTER — Encounter (HOSPITAL_COMMUNITY): Payer: Self-pay | Admitting: Emergency Medicine

## 2013-01-14 ENCOUNTER — Telehealth: Payer: Self-pay

## 2013-01-14 ENCOUNTER — Emergency Department (HOSPITAL_COMMUNITY)
Admission: EM | Admit: 2013-01-14 | Discharge: 2013-01-14 | Disposition: A | Discharge status: Home / Self Care | Payer: Medicare Other | Attending: Emergency Medicine | Admitting: Emergency Medicine

## 2013-01-14 DIAGNOSIS — M79609 Pain in unspecified limb: Secondary | ICD-10-CM | POA: Insufficient documentation

## 2013-01-14 DIAGNOSIS — Z79899 Other long term (current) drug therapy: Secondary | ICD-10-CM | POA: Insufficient documentation

## 2013-01-14 DIAGNOSIS — M7989 Other specified soft tissue disorders: Secondary | ICD-10-CM

## 2013-01-14 DIAGNOSIS — M79604 Pain in right leg: Secondary | ICD-10-CM

## 2013-01-14 DIAGNOSIS — Z87828 Personal history of other (healed) physical injury and trauma: Secondary | ICD-10-CM | POA: Insufficient documentation

## 2013-01-14 DIAGNOSIS — Z86718 Personal history of other venous thrombosis and embolism: Secondary | ICD-10-CM | POA: Insufficient documentation

## 2013-01-14 DIAGNOSIS — F0390 Unspecified dementia without behavioral disturbance: Secondary | ICD-10-CM | POA: Insufficient documentation

## 2013-01-14 DIAGNOSIS — Z85828 Personal history of other malignant neoplasm of skin: Secondary | ICD-10-CM | POA: Insufficient documentation

## 2013-01-14 DIAGNOSIS — R110 Nausea: Secondary | ICD-10-CM

## 2013-01-14 LAB — URINE MICROSCOPIC-ADD ON

## 2013-01-14 LAB — COMPREHENSIVE METABOLIC PANEL WITH GFR
ALT: 11 U/L (ref 0–35)
AST: 14 U/L (ref 0–37)
Albumin: 3.8 g/dL (ref 3.5–5.2)
Alkaline Phosphatase: 57 U/L (ref 39–117)
Chloride: 102 meq/L (ref 96–112)
Potassium: 4.1 meq/L (ref 3.5–5.1)
Total Bilirubin: 0.5 mg/dL (ref 0.3–1.2)

## 2013-01-14 LAB — CBC WITH DIFFERENTIAL/PLATELET
Basophils Absolute: 0 K/uL (ref 0.0–0.1)
Basophils Relative: 0 % (ref 0–1)
Eosinophils Absolute: 0.1 10*3/uL (ref 0.0–0.7)
Eosinophils Relative: 1 % (ref 0–5)
HCT: 39.8 % (ref 36.0–46.0)
Hemoglobin: 13.4 g/dL (ref 12.0–15.0)
Lymphocytes Relative: 12 % (ref 12–46)
Lymphs Abs: 1.1 10*3/uL (ref 0.7–4.0)
MCH: 28.5 pg (ref 26.0–34.0)
MCHC: 33.7 g/dL (ref 30.0–36.0)
MCV: 84.7 fL (ref 78.0–100.0)
Monocytes Absolute: 0.6 10*3/uL (ref 0.1–1.0)
Monocytes Relative: 7 % (ref 3–12)
Neutro Abs: 7.2 K/uL (ref 1.7–7.7)
Neutrophils Relative %: 80 % — ABNORMAL HIGH (ref 43–77)
Platelets: 290 10*3/uL (ref 150–400)
RBC: 4.7 MIL/uL (ref 3.87–5.11)
RDW: 14.1 % (ref 11.5–15.5)
WBC: 9 10*3/uL (ref 4.0–10.5)

## 2013-01-14 LAB — URINALYSIS, ROUTINE W REFLEX MICROSCOPIC
Bilirubin Urine: NEGATIVE
Glucose, UA: NEGATIVE mg/dL
Hgb urine dipstick: NEGATIVE
Ketones, ur: NEGATIVE mg/dL
Nitrite: NEGATIVE
Protein, ur: NEGATIVE mg/dL
Specific Gravity, Urine: 1.02 (ref 1.005–1.030)
Urobilinogen, UA: 0.2 mg/dL (ref 0.0–1.0)
pH: 5 (ref 5.0–8.0)

## 2013-01-14 LAB — COMPREHENSIVE METABOLIC PANEL
BUN: 26 mg/dL — ABNORMAL HIGH (ref 6–23)
CO2: 24 mEq/L (ref 19–32)
Calcium: 9.4 mg/dL (ref 8.4–10.5)
Creatinine, Ser: 1.18 mg/dL — ABNORMAL HIGH (ref 0.50–1.10)
GFR calc Af Amer: 47 mL/min — ABNORMAL LOW (ref >90)
GFR calc non Af Amer: 40 mL/min — ABNORMAL LOW (ref >90)
Glucose, Bld: 138 mg/dL — ABNORMAL HIGH (ref 70–99)
Sodium: 138 mEq/L (ref 135–145)
Total Protein: 6.8 g/dL (ref 6.0–8.3)

## 2013-01-14 MED ORDER — PROMETHAZINE HCL 25 MG PO TABS: 25.0000 mg | ORAL_TABLET | Freq: Four times a day (QID) | ORAL | Status: DC | PRN

## 2013-01-14 NOTE — ED Provider Notes (Signed)
CSN: [id number]     Arrival date & time 01/14/13  1233 History     First MD Initiated Contact with Patient 01/14/13 1500     Chief Complaint  Patient presents with  . Leg Pain    Right leg   (Consider location/radiation/quality/duration/timing/severity/associated sxs/prior Treatment) HPI Comments: Pt with h/o dementia, level 5 caveat.  Most history from daughter.  Pt twisted hip, went to [facility] on 7/20, had neg CT of hip and xray.  Pt continues to have episodes of pain, began tramadol yesterday from Dr. Pete Glatter, had 1 yesterday which helped pain per spouse.  Another this AM at 7 AM.  However by 9 AM, pt was still in bed per daughter which is unlike her and she was given breakfast and pt had sat up, was trying to eat, apparently had nausea, flushing episode that is now resolved and pt doesn't recall details of it.  No current abd pain, back pain, CP, no leg pain currently.  No SOB, cough, fevers.  Not sure if she felt lightheaded.  No prior h/o PE, MI, CAD, CVA, TIA.  No other change in meds other than tramadol  Patient is a 79 y.o. female presenting with leg pain. The history is provided by the patient, a relative and the spouse.  Leg Pain Location:  Hip Time since incident:  10 days Injury: yes   Mechanism of injury: fall   Fall:    Fall occurred:  Standing   Entrapped after fall: no   Hip location:  R hip   Past Medical History  Diagnosis Date  . Skin cancer   . Dementia   . DVT (deep venous thrombosis)    Past Surgical History  Procedure Laterality Date  . Appendectomy    . Abdominal hysterectomy     History reviewed. No pertinent family history. History  Substance Use Topics  . Smoking status: Never Smoker   . Smokeless tobacco: Not on file  . Alcohol Use: No   OB History   Grav Para Term Preterm Abortions TAB SAB Ect Mult Living                 Review of Systems  Unable to perform ROS: Dementia    Allergies  Codeine  Home Medications   Current  Outpatient Rx  Name  Route  Sig  Dispense  Refill  . diphenhydramine-acetaminophen (TYLENOL PM) 25-500 MG TABS   Oral   Take 1 tablet by mouth at bedtime as needed (for pain).          . LORazepam (ATIVAN) 1 MG tablet   Oral   Take 1 mg by mouth every 8 (eight) hours as needed for anxiety.          . Memantine HCl ER (NAMENDA XR) 28 MG CP24   Oral   Take 28 mg by mouth at bedtime.          . Menthol-Methyl Salicylate (MUSCLE RUB) 10-15 % CREA   Topical   Apply 1 application topically daily as needed (for muscle pain).         . QUEtiapine (SEROQUEL) 25 MG tablet   Oral   Take 25 mg by mouth at bedtime.         . sertraline (ZOLOFT) 25 MG tablet   Oral   Take 25 mg by mouth daily.         . traMADol (ULTRAM) 50 MG tablet   Oral   Take 50 mg by  mouth every 6 (six) hours as needed for pain.         . valsartan-hydrochlorothiazide (DIOVAN-HCT) 160-25 MG per tablet   Oral   Take 0.5 tablets by mouth daily.          . promethazine (PHENERGAN) 25 MG tablet   Oral   Take 1 tablet (25 mg total) by mouth every 6 (six) hours as needed for nausea.   20 tablet   0    BP 144/69  Pulse 71  Temp(Src) 97.8 F (36.6 C) (Oral)  Resp 16  Ht 5\' 3"  (1.6 m)  Wt 155 lb (70.308 kg)  BMI 27.46 kg/m2  SpO2 98% Physical Exam  Nursing note and vitals reviewed. Constitutional: She appears well-developed and well-nourished. No distress.  HENT:  Head: Normocephalic and atraumatic.  Eyes: EOM are normal.  Neck: Normal range of motion. Neck supple.  Cardiovascular: Normal rate, regular rhythm, normal heart sounds and intact distal pulses.   No murmur heard. Pulmonary/Chest: Effort normal. No respiratory distress. She has no wheezes. She has no rales.  Abdominal: Soft. She exhibits no distension. There is no tenderness. There is no rebound and no guarding.  Musculoskeletal:       Right hip: She exhibits normal range of motion, no tenderness and no bony tenderness.        Right knee: She exhibits normal range of motion. No tenderness found.       Lumbar back: She exhibits normal range of motion, no tenderness, no bony tenderness and no swelling.       Right lower leg: She exhibits no tenderness, no bony tenderness, no swelling, no edema, no deformity and no laceration.  Neurological: She is alert. Coordination normal.  Skin: Skin is warm and dry. No rash noted. She is not diaphoretic. No pallor.    ED Course   Procedures (including critical care time)  Labs Reviewed  CBC WITH DIFFERENTIAL - Abnormal; Notable for the following:    Neutrophils Relative % 80 (*)    All other components within normal limits  COMPREHENSIVE METABOLIC PANEL - Abnormal; Notable for the following:    Glucose, Bld 138 (*)    BUN 26 (*)    Creatinine, Ser 1.18 (*)    GFR calc non Af Amer 40 (*)    GFR calc Af Amer 47 (*)    All other components within normal limits  URINALYSIS, ROUTINE W REFLEX MICROSCOPIC - Abnormal; Notable for the following:    Leukocytes, UA SMALL (*)    All other components within normal limits  URINE MICROSCOPIC-ADD ON   No results found. 1. Nausea   2. Leg pain, lateral, right     ra sat is 98% and I interpret to be normal  ECG at time 15:21 shows rate of 61, sinus, LAD, borderline T wave changes in non contiguous leads.  Borderline ECG, no sig change from ECG on 08/26/12.  UA shows no infection, not orthostatic, ECG shows no sig ischemia.  MDM  Pt appears well, had a nausea, flushed episode around 9 AM.  Appears well now.  Slightly wide pulse pressure on initial BP, possibly pt is mildly dehydrated, had poor oral intake today, could explain episode.  BUN and Cr are mildly up here.  Pt can drink fluids, ECG doesn't show acute ischemia and pt has no memory of CP, didn't complain of CP and none now with no prior history. CT results from 7/20 reviewed with family, reassured.  Will check UA and  orthostatics otherwise.      Gavin Pound. Adelae Yodice, MD 01/14/13  1646
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
## 2013-01-14 NOTE — Telephone Encounter (Signed)
Pts daughter, betty called, states her mother was seen by dr L last Sunday and was sent to [facility] for a CT. Daughter wants to know if the lower part of the rt leg was checked here via xray and included in the CT scan

## 2013-01-14 NOTE — Telephone Encounter (Signed)
Looks like just the hip was done. Dr. Elbert Ewings, can you please review. Thanks

## 2013-01-14 NOTE — Discharge Instructions (Signed)
Nausea, Adult Nausea is the feeling that you have an upset stomach or have to vomit. Nausea by itself is not likely a serious concern, but it may be an early sign of more serious medical problems. As nausea gets worse, it can lead to vomiting. If vomiting develops, there is the risk of dehydration.  CAUSES   Viral infections.  Food poisoning.  Medicines.  Pregnancy.  Motion sickness.  Migraine headaches.  Emotional distress.  Severe pain from any source.  Alcohol intoxication. HOME CARE INSTRUCTIONS  Get plenty of rest.  Ask your caregiver about specific rehydration instructions.  Eat small amounts of food and sip liquids more often.  Take all medicines as told by your caregiver. SEEK MEDICAL CARE IF:  You have not improved after 2 days, or you get worse.  You have a headache. SEEK IMMEDIATE MEDICAL CARE IF:   You have a fever.  You faint.  You keep vomiting or have blood in your vomit.  You are extremely weak or dehydrated.  You have dark or bloody stools.  You have severe chest or abdominal pain. MAKE SURE YOU:  Understand these instructions.  Will watch your condition.  Will get help right away if you are not doing well or get worse. Document Released: 07/16/2004 Document Revised: 03/02/2012 Document Reviewed: 02/18/2011 [employer] Patient Information 2014 [employer].  

## 2013-01-14 NOTE — ED Notes (Signed)
Per patient's daughter states that the patient fell and/or twisted her right leg while trying to unplug a clothes dryer on July 18th. July 19th patient was seen at [facility] for pain in the right hip and leg. Patient has since had CT at [facility] and is to start therapy on Monday. Patient has continued to have increasing pain and daughter has been unable to get information from doctors office regarding xray and MRI results. Patient does have dementia. Daughter and patient's husband at bedside.

## 2013-01-14 NOTE — Progress Notes (Signed)
VASCULAR LAB PRELIMINARY  PRELIMINARY  PRELIMINARY  PRELIMINARY  Right lower extremity venous duplex completed.    Preliminary report:  Right:  No evidence of DVT, superficial thrombosis, or Baker's cyst.  Jill Watkins, RVS 01/14/2013, 4:20 PM

## 2013-01-16 ENCOUNTER — Ambulatory Visit: Payer: Medicare Other | Admitting: Rehabilitative and Restorative Service Providers"

## 2013-01-16 ENCOUNTER — Ambulatory Visit: Payer: Medicare Other | Attending: Geriatric Medicine | Admitting: Rehabilitative and Restorative Service Providers"

## 2013-01-16 DIAGNOSIS — IMO0001 Reserved for inherently not codable concepts without codable children: Secondary | ICD-10-CM | POA: Insufficient documentation

## 2013-01-16 DIAGNOSIS — M25559 Pain in unspecified hip: Secondary | ICD-10-CM | POA: Insufficient documentation

## 2013-01-16 DIAGNOSIS — R269 Unspecified abnormalities of gait and mobility: Secondary | ICD-10-CM | POA: Insufficient documentation

## 2013-01-16 NOTE — Telephone Encounter (Signed)
Only hip was looked at during CT - if patient is having problems with her leg she should RTC.

## 2013-01-17 NOTE — Telephone Encounter (Signed)
Called her daughter to advise. Left message for her to call me back.

## 2013-01-18 ENCOUNTER — Ambulatory Visit: Payer: Medicare Other | Admitting: Rehabilitation

## 2013-01-19 NOTE — Telephone Encounter (Signed)
Spoke with daughter and I advised her that her CT scan was normal, but she took pt to the ED and they did not give her any meds for the pain, she also went a couple of other places one did not do anything but the last one she went to finally gave her some muscle relaxers and sent her to PT.  She also stated about how long it took for [place] to get back to her and I advised her that we are working on that problem so we can better serve her and other pts and apologized.

## 2013-01-20 ENCOUNTER — Ambulatory Visit: Payer: Medicare Other | Attending: Geriatric Medicine | Admitting: Rehabilitation

## 2013-01-20 DIAGNOSIS — R269 Unspecified abnormalities of gait and mobility: Secondary | ICD-10-CM | POA: Insufficient documentation

## 2013-01-20 DIAGNOSIS — IMO0001 Reserved for inherently not codable concepts without codable children: Secondary | ICD-10-CM | POA: Insufficient documentation

## 2013-01-20 DIAGNOSIS — M25559 Pain in unspecified hip: Secondary | ICD-10-CM | POA: Insufficient documentation

## 2013-01-22 ENCOUNTER — Telehealth: Payer: Self-pay

## 2013-01-22 NOTE — Telephone Encounter (Signed)
Patient's mom would like someone to recheck xrays and CT scan to see if we have misdiagnosed or missed something because her mom is in a lot of pain and she's been to the ED a few times and nobody has found anything. Daughter thinks there may be a fracture in her hip that we missed.

## 2013-01-23 ENCOUNTER — Ambulatory Visit: Payer: Medicare Other | Admitting: Rehabilitation

## 2013-01-23 NOTE — Telephone Encounter (Signed)
If she is still painful, she should come back in. Can repeat xray to make sure, if fracture was missed first xray, may show now, if beginning to heal. Called daughter advised her to come back in today.

## 2013-01-25 ENCOUNTER — Ambulatory Visit: Payer: Medicare Other | Admitting: Physical Therapy

## 2013-01-26 ENCOUNTER — Ambulatory Visit: Payer: Medicare Other | Admitting: Rehabilitation

## 2013-01-30 ENCOUNTER — Ambulatory Visit: Payer: Medicare Other | Admitting: Rehabilitation

## 2013-02-01 ENCOUNTER — Ambulatory Visit: Payer: Medicare Other | Admitting: Rehabilitation

## 2013-02-03 ENCOUNTER — Ambulatory Visit: Payer: Medicare Other | Admitting: Rehabilitation

## 2013-02-07 ENCOUNTER — Ambulatory Visit (INDEPENDENT_AMBULATORY_CARE_PROVIDER_SITE_OTHER): Payer: Medicare Other

## 2013-11-15 ENCOUNTER — Emergency Department (HOSPITAL_COMMUNITY): Payer: Medicare Other

## 2013-11-15 ENCOUNTER — Encounter (HOSPITAL_COMMUNITY): Payer: Self-pay | Admitting: Emergency Medicine

## 2013-11-15 ENCOUNTER — Emergency Department (HOSPITAL_COMMUNITY)
Admission: EM | Admit: 2013-11-15 | Discharge: 2013-11-15 | Disposition: A | Discharge status: Home / Self Care | Payer: Medicare Other | Attending: Emergency Medicine | Admitting: Emergency Medicine

## 2013-11-15 DIAGNOSIS — Z85828 Personal history of other malignant neoplasm of skin: Secondary | ICD-10-CM | POA: Insufficient documentation

## 2013-11-15 DIAGNOSIS — IMO0002 Reserved for concepts with insufficient information to code with codable children: Secondary | ICD-10-CM | POA: Insufficient documentation

## 2013-11-15 DIAGNOSIS — Y9389 Activity, other specified: Secondary | ICD-10-CM | POA: Insufficient documentation

## 2013-11-15 DIAGNOSIS — S0001XA Abrasion of scalp, initial encounter: Secondary | ICD-10-CM

## 2013-11-15 DIAGNOSIS — F0390 Unspecified dementia without behavioral disturbance: Secondary | ICD-10-CM | POA: Insufficient documentation

## 2013-11-15 DIAGNOSIS — Z86718 Personal history of other venous thrombosis and embolism: Secondary | ICD-10-CM | POA: Insufficient documentation

## 2013-11-15 DIAGNOSIS — W19XXXA Unspecified fall, initial encounter: Secondary | ICD-10-CM

## 2013-11-15 DIAGNOSIS — Y9289 Other specified places as the place of occurrence of the external cause: Secondary | ICD-10-CM | POA: Insufficient documentation

## 2013-11-15 DIAGNOSIS — Z23 Encounter for immunization: Secondary | ICD-10-CM | POA: Insufficient documentation

## 2013-11-15 DIAGNOSIS — Z79899 Other long term (current) drug therapy: Secondary | ICD-10-CM | POA: Insufficient documentation

## 2013-11-15 DIAGNOSIS — W010XXA Fall on same level from slipping, tripping and stumbling without subsequent striking against object, initial encounter: Secondary | ICD-10-CM | POA: Insufficient documentation

## 2013-11-15 MED ORDER — TETANUS-DIPHTH-ACELL PERTUSSIS 5-2.5-18.5 LF-MCG/0.5 IM SUSP
0.5000 mL | Freq: Once | INTRAMUSCULAR | Status: AC
  Administered 2013-11-15: 0.5 mL via INTRAMUSCULAR
  Filled 2013-11-15: qty 0.5

## 2013-11-15 NOTE — Discharge Instructions (Signed)
Fall Prevention and Home Safety Falls cause injuries and can affect all age groups. It is possible to use preventive measures to significantly decrease the likelihood of falls. There are many simple measures which can make your home safer and prevent falls. OUTDOORS  Repair cracks and edges of walkways and driveways.  Remove high doorway thresholds.  Trim shrubbery on the main path into your home.  Have good outside lighting.  Clear walkways of tools, rocks, debris, and clutter.  Check that handrails are not broken and are securely fastened. Both sides of steps should have handrails.  Have leaves, snow, and ice cleared regularly.  Use sand or salt on walkways during winter months.  In the garage, clean up grease or oil spills. BATHROOM  Install night lights.  Install grab bars by the toilet and in the tub and shower.  Use non-skid mats or decals in the tub or shower.  Place a plastic non-slip stool in the shower to sit on, if needed.  Keep floors dry and clean up all water on the floor immediately.  Remove soap buildup in the tub or shower on a regular basis.  Secure bath mats with non-slip, double-sided rug tape.  Remove throw rugs and tripping hazards from the floors. BEDROOMS  Install night lights.  Make sure a bedside light is easy to reach.  Do not use oversized bedding.  Keep a telephone by your bedside.  Have a firm chair with side arms to use for getting dressed.  Remove throw rugs and tripping hazards from the floor. KITCHEN  Keep handles on pots and pans turned toward the center of the stove. Use back burners when possible.  Clean up spills quickly and allow time for drying.  Avoid walking on wet floors.  Avoid hot utensils and knives.  Position shelves so they are not too high or low.  Place commonly used objects within easy reach.  If necessary, use a sturdy step stool with a grab bar when reaching.  Keep electrical cables out of the  way.  Do not use floor polish or wax that makes floors slippery. If you must use wax, use non-skid floor wax.  Remove throw rugs and tripping hazards from the floor. STAIRWAYS  Never leave objects on stairs.  Place handrails on both sides of stairways and use them. Fix any loose handrails. Make sure handrails on both sides of the stairways are as long as the stairs.  Check carpeting to make sure it is firmly attached along stairs. Make repairs to worn or loose carpet promptly.  Avoid placing throw rugs at the top or bottom of stairways, or properly secure the rug with carpet tape to prevent slippage. Get rid of throw rugs, if possible.  Have an electrician put in a light switch at the top and bottom of the stairs. OTHER FALL PREVENTION TIPS  Wear low-heel or rubber-soled shoes that are supportive and fit well. Wear closed toe shoes.  When using a stepladder, make sure it is fully opened and both spreaders are firmly locked. Do not climb a closed stepladder.  Add color or contrast paint or tape to grab bars and handrails in your home. Place contrasting color strips on first and last steps.  Learn and use mobility aids as needed. Install an electrical emergency response system.  Turn on lights to avoid dark areas. Replace light bulbs that burn out immediately. Get light switches that glow.  Arrange furniture to create clear pathways. Keep furniture in the same place.  Firmly attach carpet with non-skid or double-sided tape.  Eliminate uneven floor surfaces.  Select a carpet pattern that does not visually hide the edge of steps.  Be aware of all pets. OTHER HOME SAFETY TIPS  Set the water temperature for 120 F (48.8 C).  Keep emergency numbers on or near the telephone.  Keep smoke detectors on every level of the home and near sleeping areas. Document Released: 05/29/2002 Document Revised: 12/08/2011 Document Reviewed: 08/28/2011 [facility] Patient Information 2014  [place].  Abrasions An abrasion is a cut or scrape of the skin. Abrasions do not go through all layers of the skin. HOME CARE  If a bandage (dressing) was put on your wound, change it as told by your doctor. If the bandage sticks, soak it off with warm.  Wash the area with water and soap 2 times a day. Rinse off the soap. Pat the area dry with a clean towel.  Put on medicated cream (ointment) as told by your doctor.  Change your bandage right away if it gets wet or dirty.  Only take medicine as told by your doctor.  See your doctor within 24 48 hours to get your wound checked.  Check your wound for redness, puffiness (swelling), or yellowish-white fluid (pus). GET HELP RIGHT AWAY IF:   You have more pain in the wound.  You have redness, swelling, or tenderness around the wound.  You have pus coming from the wound.  You have a fever or lasting symptoms for more than 2 3 days.  You have a fever and your symptoms suddenly get worse.  You have a bad smell coming from the wound or bandage. MAKE SURE YOU:   Understand these instructions.  Will watch your condition.  Will get help right away if you are not doing well or get worse. Document Released: 11/25/2007 Document Revised: 03/02/2012 Document Reviewed: 05/12/2011 [facility] Patient Information 2014 [place], [place].

## 2013-11-15 NOTE — ED Provider Notes (Signed)
CSN: [id number]     Arrival date & time 11/15/13  1135 History   First MD Initiated Contact with Patient 11/15/13 1244     Chief Complaint  Patient presents with  . Fall     (Consider location/radiation/quality/duration/timing/severity/associated sxs/prior Treatment) HPI Comments: Level 5 caveat applies secondary to dementia.  Patient is an 80 y.o female who presents from home after a witnessed mechanical fall. Daughter states patient slipped and fell backwards onto some brick steps, striking her low back and the back of her head. No LOC. Daughter states patient is acting "calmer" than her usual baseline. Daughter denies facial drooping, incontinence, vomiting, and extremity weakness. Patient is not on blood thinners.  Patient is a 81 y.o. female presenting with fall. The history is provided by a relative (hx provided by daughter). No language interpreter was used.  Fall Pertinent negatives include no vomiting or weakness.    Past Medical History  Diagnosis Date  . Skin cancer   . Dementia   . DVT (deep venous thrombosis)    Past Surgical History  Procedure Laterality Date  . Appendectomy    . Abdominal hysterectomy     History reviewed. No pertinent family history. History  Substance Use Topics  . Smoking status: Never Smoker   . Smokeless tobacco: Not on file  . Alcohol Use: No   OB History   Grav Para Term Preterm Abortions TAB SAB Ect Mult Living                 Review of Systems  Unable to perform ROS: Dementia  Gastrointestinal: Negative for vomiting.  Genitourinary:       No incontinence  Skin: Positive for wound.  Neurological: Negative for facial asymmetry and weakness.      Allergies  Codeine  Home Medications   Prior to Admission medications   Medication Sig Start Date End Date Taking? Authorizing Provider  acetaminophen (TYLENOL) 500 MG tablet Take 500 mg by mouth every 6 (six) hours as needed for mild pain.   Yes Historical Provider, MD   LORazepam (ATIVAN) 1 MG tablet Take 1 mg by mouth every 8 (eight) hours as needed for anxiety.    Yes Historical Provider, MD  QUEtiapine (SEROQUEL) 25 MG tablet Take 25 mg by mouth at bedtime.   Yes Historical Provider, MD  valsartan-hydrochlorothiazide (DIOVAN-HCT) 160-25 MG per tablet Take 0.5 tablets by mouth daily.    Yes Historical Provider, MD   BP 162/70  Pulse 71  Temp(Src) 97.5 F (36.4 C) (Oral)  Resp 17  SpO2 99%  Physical Exam  Nursing note and vitals reviewed. Constitutional: She appears well-developed and well-nourished. No distress.  Patient clearly articulates words, but sentences are nonsensical; daughter appreciates this to be baseline from dementia.  HENT:  Head: Normocephalic.  Mouth/Throat: Oropharynx is clear and moist. No oropharyngeal exudate.  Scalp abrasion to posterior scalp. No battle's sign or raccoon's eyes  Eyes: Conjunctivae and EOM are normal. Pupils are equal, round, and reactive to light. No scleral icterus.  Neck: Normal range of motion. Neck supple.  Patient moves neck with ease. No cervical spine TTP. No bony deformities or step offs palpated.  Cardiovascular: Normal rate, regular rhythm and normal heart sounds.   Pulmonary/Chest: Effort normal and breath sounds normal. No respiratory distress. She has no wheezes. She has no rales.  Abdominal: Soft. She exhibits no distension. There is no tenderness. There is no rebound and no guarding.  Musculoskeletal: Normal range of motion. She exhibits no  tenderness.  No TTP of the cervical, thoracic, or lumbosacral midline. No bony deformities or step offs palpated.  Neurological: She is alert. She has normal reflexes. No cranial nerve deficit. She exhibits normal muscle tone. Coordination normal.  Patient alert. She follows simple commands and moves her extremities without ataxia. Good b/l grip strength. DTRs normal and symmetric. No cranial nerve deficits appreciated.  Skin: Skin is warm and dry. No rash  noted. She is not diaphoretic. No erythema. No pallor.  Superficial abrasion/scratch to R low back  Psychiatric: She has a normal mood and affect.    ED Course  Procedures (including critical care time) Labs Review Labs Reviewed - No data to display  Imaging Review Dg Chest 2 View  11/15/2013   CLINICAL DATA:  fall fall  EXAM: CHEST - 2 VIEW  COMPARISON:  10/17/2003  FINDINGS: Lungs are hyperinflated with coarse interstitial markings as before. No confluent airspace infiltrate or overt edema. Heart size upper limits normal. Tortuous atheromatous aorta. . . No effusion. Mild compression deformity in the upper lumbar spine.  IMPRESSION: 1. Chronic interstitial changes without acute or superimposed abnormality. 2. Upper lumbar compression deformity, age indeterminate.   Electronically Signed   By: Arne Cleveland M.D.   On: 11/15/2013 15:07   Dg Lumbar Spine Complete  11/15/2013   CLINICAL DATA:  Pain post trauma  EXAM: LUMBAR SPINE - COMPLETE 4+ VIEW  COMPARISON:  None.  FINDINGS: Frontal, lateral, spot lumbosacral lateral, and bilateral oblique views were obtained. The there are 5 non-rib-bearing lumbar type vertebral bodies. There is no fracture or spondylolisthesis. There is moderate disc space narrowing at L1-2 and L2-3. There are multiple lateral osteophytes throughout the lumbar spine. There is facet arthropathy at all levels bilaterally. There is atherosclerotic change in the aorta.  IMPRESSION: Areas of osteoarthritic change. Atherosclerotic calcification present. No acute fracture or spondylolisthesis apparent.   Electronically Signed   By: Lowella Grip M.D.   On: 11/15/2013 14:03   Dg Pelvis 1-2 Views  11/15/2013   CLINICAL DATA:  FALL FALL  EXAM: PELVIS - 1-2 VIEW  COMPARISON:  None.  FINDINGS: There is no evidence of pelvic fracture or diastasis. No other pelvic bone lesions are seen. Bilateral pelvic phleboliths. Femoral necks not well profiled.  IMPRESSION: Negative.    Electronically Signed   By: Arne Cleveland M.D.   On: 11/15/2013 15:03   Ct Head Wo Contrast  11/15/2013   CLINICAL DATA:  [place] less morning hitting back of head. Scalp laceration. Confusion.  EXAM: CT HEAD WITHOUT CONTRAST  CT CERVICAL SPINE WITHOUT CONTRAST  TECHNIQUE: Multidetector CT imaging of the head and cervical spine was performed following the standard protocol without intravenous contrast. Multiplanar CT image reconstructions of the cervical spine were also generated.  COMPARISON:  03/20/2010  FINDINGS: CT HEAD FINDINGS  Ventricles are normal in configuration. There is ventricular and sulcal enlargement reflecting moderate atrophy. There are no parenchymal masses or mass effect. Periventricular white matter hypoattenuation is noted most consistent with mild to moderate chronic microvascular ischemic change. There is no evidence of a recent infarct.  There are no extra-axial masses or abnormal fluid collections.  No intracranial hemorrhage.  No skull fracture. Visualized sinuses and mastoid air cells are clear.  CT CERVICAL SPINE FINDINGS  No fracture.  No spondylolisthesis.  There are degenerative changes with moderate loss of disk height at C4-C5 and marked loss of disk height at C5-C6 and mild to moderate loss of disk height at C6-C7. Facet  degenerative changes are noted most probably on the right at C 6-C7 and C to- C3 and on the left at C4-C5.  Bones are demineralized.  There are small thyroid nodules. Soft tissues are otherwise unremarkable. Mild scarring is noted at the lung apices.  IMPRESSION: HEAD CT:  No acute intracranial abnormalities.  No skull fracture.  CERVICAL CT:  No fracture or acute finding   Electronically Signed   By: Lajean Manes M.D.   On: 11/15/2013 13:35   Ct Cervical Spine Wo Contrast  11/15/2013   CLINICAL DATA:  [place] less morning hitting back of head. Scalp laceration. Confusion.  EXAM: CT HEAD WITHOUT CONTRAST  CT CERVICAL SPINE WITHOUT CONTRAST  TECHNIQUE:  Multidetector CT imaging of the head and cervical spine was performed following the standard protocol without intravenous contrast. Multiplanar CT image reconstructions of the cervical spine were also generated.  COMPARISON:  03/20/2010  FINDINGS: CT HEAD FINDINGS  Ventricles are normal in configuration. There is ventricular and sulcal enlargement reflecting moderate atrophy. There are no parenchymal masses or mass effect. Periventricular white matter hypoattenuation is noted most consistent with mild to moderate chronic microvascular ischemic change. There is no evidence of a recent infarct.  There are no extra-axial masses or abnormal fluid collections.  No intracranial hemorrhage.  No skull fracture. Visualized sinuses and mastoid air cells are clear.  CT CERVICAL SPINE FINDINGS  No fracture.  No spondylolisthesis.  There are degenerative changes with moderate loss of disk height at C4-C5 and marked loss of disk height at C5-C6 and mild to moderate loss of disk height at C6-C7. Facet degenerative changes are noted most probably on the right at C 6-C7 and C to- C3 and on the left at C4-C5.  Bones are demineralized.  There are small thyroid nodules. Soft tissues are otherwise unremarkable. Mild scarring is noted at the lung apices.  IMPRESSION: HEAD CT:  No acute intracranial abnormalities.  No skull fracture.  CERVICAL CT:  No fracture or acute finding   Electronically Signed   By: Lajean Manes M.D.   On: 11/15/2013 13:35     Date: 11/16/2013  Rate: 64  Rhythm: normal sinus rhythm  QRS Axis: left (borderline)  Intervals: normal  ST/T Wave abnormalities: normal  Conduction Disutrbances:none  Narrative Interpretation: NRS; no ischemic change or STEMI. Normal PR and QTc intervals  Old EKG Reviewed: none available I have personally reviewed and interpreted this EKG   MDM   Final diagnoses:  Fall  Scalp abrasion    80 y.o female presents from home after slip and fall with impact to posterior  scalp and low back. No red flags or signs concerning for cauda equina. Neurologic exam nonfocal. Physical exam with abrasion to posterior scalp at point of impact without laceration. Patient not on chronic blood thinners. Imaging negative today. Per family, patient mentating at baseline c/w dementia hx; only change was that patient has been "calmer" since fall. Patient stable for d/c back to home where she lives with her husband. Return precautions provided and family agreeable to plan with no unaddressed concerns.   Filed Vitals:   11/15/13 1151 11/15/13 1415 11/15/13 1430 11/15/13 1533  BP: 139/63 162/70  130/79  Pulse: 78 61 71 86  Temp: 97.5 F (36.4 C)     TempSrc: Oral     Resp: 14  17   SpO2: 98% 94% 99% 100%       Antonietta Breach, PA-C 11/16/13 1235

## 2013-11-15 NOTE — ED Notes (Signed)
Pt's family reports that she fell on some steps this morning. States that she hit the back of her head on some bricks. Pt reports that she is not on blood thinners. Family reports that pt is not acting her normal at this time, she is normally more active but is quiet and calmer than usual. No neuro deficits.

## 2013-11-15 NOTE — ED Notes (Signed)
MD at bedside. 

## 2013-11-16 NOTE — ED Provider Notes (Signed)
Medical screening examination/treatment/procedure(s) were conducted as a shared visit with non-physician practitioner(s) and myself.  I personally evaluated the patient during the encounter. Witnessed fall without LOC.  No anticoagulants.  Dementia at baseline.  Neurologically intact. At baseline per family.  CT head and C spine negative.  Abrasion to mid/low back   EKG Interpretation None       Ezequiel Essex, MD 11/16/13 1259

## 2015-04-29 ENCOUNTER — Emergency Department (HOSPITAL_COMMUNITY): Payer: Medicare Other

## 2015-04-29 ENCOUNTER — Emergency Department (HOSPITAL_COMMUNITY)
Admission: EM | Admit: 2015-04-29 | Discharge: 2015-04-29 | Disposition: A | Discharge status: Home / Self Care | Payer: Medicare Other | Attending: Emergency Medicine | Admitting: Emergency Medicine

## 2015-04-29 ENCOUNTER — Encounter (HOSPITAL_COMMUNITY): Payer: Self-pay | Admitting: Emergency Medicine

## 2015-04-29 DIAGNOSIS — Z86718 Personal history of other venous thrombosis and embolism: Secondary | ICD-10-CM | POA: Insufficient documentation

## 2015-04-29 DIAGNOSIS — Z79899 Other long term (current) drug therapy: Secondary | ICD-10-CM | POA: Diagnosis not present

## 2015-04-29 DIAGNOSIS — N390 Urinary tract infection, site not specified: Secondary | ICD-10-CM | POA: Diagnosis not present

## 2015-04-29 DIAGNOSIS — R4182 Altered mental status, unspecified: Secondary | ICD-10-CM

## 2015-04-29 DIAGNOSIS — F0390 Unspecified dementia without behavioral disturbance: Secondary | ICD-10-CM | POA: Insufficient documentation

## 2015-04-29 DIAGNOSIS — Z85828 Personal history of other malignant neoplasm of skin: Secondary | ICD-10-CM | POA: Insufficient documentation

## 2015-04-29 DIAGNOSIS — R5383 Other fatigue: Secondary | ICD-10-CM | POA: Diagnosis not present

## 2015-04-29 LAB — CBC
HCT: 34 % — ABNORMAL LOW (ref 36.0–46.0)
Hemoglobin: 10.5 g/dL — ABNORMAL LOW (ref 12.0–15.0)
MCH: 26 pg (ref 26.0–34.0)
MCHC: 30.9 g/dL (ref 30.0–36.0)
MCV: 84.2 fL (ref 78.0–100.0)
Platelets: 222 10*3/uL (ref 150–400)
RBC: 4.04 MIL/uL (ref 3.87–5.11)
RDW: 14.5 % (ref 11.5–15.5)
WBC: 7.7 10*3/uL (ref 4.0–10.5)

## 2015-04-29 LAB — COMPREHENSIVE METABOLIC PANEL
ALT: 10 U/L — ABNORMAL LOW (ref 14–54)
AST: 16 U/L (ref 15–41)
Albumin: 3.2 g/dL — ABNORMAL LOW (ref 3.5–5.0)
Alkaline Phosphatase: 46 U/L (ref 38–126)
Anion gap: 10 (ref 5–15)
BUN: 20 mg/dL (ref 6–20)
CO2: 24 mmol/L (ref 22–32)
Calcium: 9 mg/dL (ref 8.9–10.3)
Chloride: 105 mmol/L (ref 101–111)
Creatinine, Ser: 1.37 mg/dL — ABNORMAL HIGH (ref 0.44–1.00)
GFR calc Af Amer: 38 mL/min — ABNORMAL LOW (ref >60)
GFR calc non Af Amer: 33 mL/min — ABNORMAL LOW (ref >60)
Glucose, Bld: 103 mg/dL — ABNORMAL HIGH (ref 65–99)
Potassium: 4.2 mmol/L (ref 3.5–5.1)
Sodium: 139 mmol/L (ref 135–145)
Total Bilirubin: 1.4 mg/dL — ABNORMAL HIGH (ref 0.3–1.2)
Total Protein: 6.5 g/dL (ref 6.5–8.1)

## 2015-04-29 LAB — URINALYSIS, ROUTINE W REFLEX MICROSCOPIC
Bilirubin Urine: NEGATIVE
Glucose, UA: NEGATIVE mg/dL
Ketones, ur: NEGATIVE mg/dL
Nitrite: NEGATIVE
Protein, ur: 30 mg/dL — AB
Specific Gravity, Urine: 1.017 (ref 1.005–1.030)
Urobilinogen, UA: 1 mg/dL (ref 0.0–1.0)
pH: 5.5 (ref 5.0–8.0)

## 2015-04-29 LAB — URINE MICROSCOPIC-ADD ON

## 2015-04-29 MED ORDER — SODIUM CHLORIDE 0.9 % IV BOLUS (SEPSIS)
500.0000 mL | Freq: Once | INTRAVENOUS | Status: AC
  Administered 2015-04-29: 500 mL via INTRAVENOUS

## 2015-04-29 MED ORDER — DEXTROSE 5 % IV SOLN
1.0000 g | Freq: Once | INTRAVENOUS | Status: AC
  Administered 2015-04-29: 1 g via INTRAVENOUS
  Filled 2015-04-29: qty 10

## 2015-04-29 MED ORDER — CEPHALEXIN 500 MG PO CAPS: 500.0000 mg | ORAL_CAPSULE | Freq: Three times a day (TID) | ORAL | Status: AC

## 2015-04-29 NOTE — Discharge Instructions (Signed)

## 2015-04-29 NOTE — ED Notes (Signed)
Per ems pt was sent from home due to altered mental status and increase in confusion and being lethergic. Per ems family last seen pt normal last night before bed around 900pm and pt did not get out of bed all day. Pt is alert but no able to answer questions. Hx of dementia. Baseline is confused at times but normal oriented to self.

## 2015-04-29 NOTE — ED Provider Notes (Signed)
CSN: [id number]     Arrival date & time 04/29/15  1838 History   First MD Initiated Contact with Patient 04/29/15 1906     Chief Complaint  Patient presents with  . Altered Mental Status     (Consider location/radiation/quality/duration/timing/severity/associated sxs/prior Treatment) HPI   81 year old female brought in by her husband and daughter for evaluation of change in her mental status. Noticed this morning and has persisted throughout the day. Seemed to be more or less are normal self when she went to bed last night. Patient has a history of advanced dementia. She is normally more alert than she has been today though. No recent medication changes. No trauma that they're aware of. No vomiting. Not eating today like she normally would.   Past Medical History  Diagnosis Date  . Skin cancer   . Dementia   . DVT (deep venous thrombosis) [facility])    Past Surgical History  Procedure Laterality Date  . Appendectomy    . Abdominal hysterectomy     No family history on file. Social History  Substance Use Topics  . Smoking status: Never Smoker   . Smokeless tobacco: None  . Alcohol Use: No   OB History    No data available     Review of Systems  Level V caveat because of advanced dementia and patient is essentially nonverbal.   Allergies  Codeine  Home Medications   Prior to Admission medications   Medication Sig Start Date End Date Taking? Authorizing Provider  acetaminophen (TYLENOL) 500 MG tablet Take 500 mg by mouth every 6 (six) hours as needed for mild pain.   Yes Historical Provider, MD  amLODipine (NORVASC) 2.5 MG tablet Take 2.5 mg by mouth daily.   Yes Historical Provider, MD  LORazepam (ATIVAN) 1 MG tablet Take 1 mg by mouth every 8 (eight) hours as needed for anxiety.    Yes Historical Provider, MD   BP 159/72 mmHg  Pulse 68  Temp(Src) 99.3 F (37.4 C) (Rectal)  Resp 16  Ht 5\' 5"  (1.651 m)  Wt 155 lb (70.308 kg)  BMI 25.79 kg/m2  SpO2 97% Physical  Exam  Constitutional: She appears well-developed and well-nourished. No distress.  HENT:  Head: Normocephalic and atraumatic.  Eyes: Conjunctivae are normal. Right eye exhibits no discharge. Left eye exhibits no discharge.  Neck: Neck supple.  Cardiovascular: Normal rate, regular rhythm and normal heart sounds.  Exam reveals no gallop and no friction rub.   No murmur heard. Pulmonary/Chest: Effort normal and breath sounds normal. No respiratory distress.  Abdominal: Soft. She exhibits no distension. There is no tenderness.  Musculoskeletal: She exhibits no edema or tenderness.  Neurological: She is alert.  Patient will respond to questions although her answers are not discernible to me. Appears comfortable at rest but does become somewhat combative when examined. She has very good strength in all extremities which seems symmetric. Perhaps a mild right-sided facial droop. Patient is laying in the bed on her right side though. Any attempts to try to roll her into a more supine position just agitated though. Gross neuro exam is difficult to obtainthis.following commands well.  Skin: Skin is warm and dry.  Nursing note and vitals reviewed.   ED Course  Procedures (including critical care time) Labs Review Labs Reviewed  COMPREHENSIVE METABOLIC PANEL - Abnormal; Notable for the following:    Glucose, Bld 103 (*)    Creatinine, Ser 1.37 (*)    Albumin 3.2 (*)    ALT  10 (*)    Total Bilirubin 1.4 (*)    GFR calc non Af Amer 33 (*)    GFR calc Af Amer 38 (*)    All other components within normal limits  CBC - Abnormal; Notable for the following:    Hemoglobin 10.5 (*)    HCT 34.0 (*)    All other components within normal limits  URINALYSIS, ROUTINE W REFLEX MICROSCOPIC (NOT AT [facility]) - Abnormal; Notable for the following:    Hgb urine dipstick SMALL (*)    Protein, ur 30 (*)    Leukocytes, UA TRACE (*)    All other components within normal limits  URINE MICROSCOPIC-ADD ON - Abnormal;  Notable for the following:    Squamous Epithelial / LPF FEW (*)    Bacteria, UA FEW (*)    Casts HYALINE CASTS (*)    All other components within normal limits  URINE CULTURE    Imaging Review Ct Head Wo Contrast  04/29/2015  CLINICAL DATA:  Altered mental status, lethargy EXAM: CT HEAD WITHOUT CONTRAST TECHNIQUE: Contiguous axial images were obtained from the base of the skull through the vertex without intravenous contrast. COMPARISON:  11/15/2013 FINDINGS: Severe age-related atrophy and low attenuation in the deep white matter. Proportionate ex vacuo dilatation of the ventricles. No evidence of acute vascular territory infarct. No hemorrhage or extra-axial fluid. Calvarium intact. Extensive opacification involving bilateral ethmoid air cells. Moderate chronic inflammatory change of the maxillary sinuses. IMPRESSION: Ethmoid and maxillary sinusitis with a chronic appearance but nonetheless new when compared to 11/15/2013. Severe age-related involutional change with no acute intracranial findings. Electronically Signed   By: Skipper Cliche M.D.   On: 04/29/2015 21:17   I have personally reviewed and evaluated these images and lab results as part of my medical decision-making.   EKG Interpretation None      MDM   Final diagnoses:  Altered mental status  UTI (lower urinary tract infection)    81 year old female with change in mental status. Patient has a history of advanced dementia. Family noticed that since this morning she has been very sleepy. She is on Ativan, but family reports she has been on this medication for quite some time. No concerns for possible ingestion. Unfortunately unable to perform ROS. Will not follow commands. She does resist attempted range of motion in all extremities and has very good strength. Perhaps a mild right facial droop. Her daughter reports that she has distant history of Bell's palsy. She does not think that her appearance looks significantly different  than normal. Workup significant for possible urinary tract infection. Will send urine culture. Antibiotics. Discussed with the family admission overnight for observation versus discharge. She is afebrile and appears to be in no acute distress. I feel outpatient treatment is reasonable at this time. She is noted to be hypertensive but in an 81 year old with advanced dementia, I do not see any pressing need to aggressively treat this especially since I do not feel that it is contributing to her current symptoms. They feel very comfortable taking her home at this time. She generally appears to be very well taken care of and daughter/husband seem very involved. They report that they have additional caretakers at home as well. Advised that she needs repeat evaluation if she does not show signs of improvement in the next 24-36 hours of being on antibiotics.   Virgel Manifold, MD 05/09/15 [phone]

## 2015-05-01 DIAGNOSIS — R3989 Other symptoms and signs involving the genitourinary system: Secondary | ICD-10-CM | POA: Diagnosis not present

## 2015-05-01 DIAGNOSIS — F0390 Unspecified dementia without behavioral disturbance: Secondary | ICD-10-CM | POA: Diagnosis not present

## 2015-05-02 LAB — URINE CULTURE

## 2016-01-02 ENCOUNTER — Ambulatory Visit
Admission: RE | Admit: 2016-01-02 | Discharge: 2016-01-02 | Disposition: A | Discharge status: Home / Self Care | Payer: Medicare Other | Source: Ambulatory Visit | Attending: Internal Medicine | Admitting: Internal Medicine

## 2016-01-02 ENCOUNTER — Other Ambulatory Visit: Payer: Self-pay | Admitting: Internal Medicine

## 2016-01-02 DIAGNOSIS — I82402 Acute embolism and thrombosis of unspecified deep veins of left lower extremity: Secondary | ICD-10-CM | POA: Diagnosis not present

## 2016-01-02 DIAGNOSIS — I824Y2 Acute embolism and thrombosis of unspecified deep veins of left proximal lower extremity: Secondary | ICD-10-CM

## 2016-01-02 DIAGNOSIS — M79605 Pain in left leg: Secondary | ICD-10-CM

## 2016-01-02 DIAGNOSIS — M549 Dorsalgia, unspecified: Secondary | ICD-10-CM | POA: Diagnosis not present

## 2016-01-02 DIAGNOSIS — M79662 Pain in left lower leg: Secondary | ICD-10-CM | POA: Diagnosis not present

## 2016-01-02 DIAGNOSIS — S3992XA Unspecified injury of lower back, initial encounter: Secondary | ICD-10-CM | POA: Diagnosis not present

## 2016-01-02 DIAGNOSIS — F0390 Unspecified dementia without behavioral disturbance: Secondary | ICD-10-CM | POA: Diagnosis not present

## 2016-01-02 DIAGNOSIS — R8290 Unspecified abnormal findings in urine: Secondary | ICD-10-CM | POA: Diagnosis not present

## 2016-01-14 DIAGNOSIS — I82402 Acute embolism and thrombosis of unspecified deep veins of left lower extremity: Secondary | ICD-10-CM | POA: Diagnosis not present

## 2016-02-11 DIAGNOSIS — I82402 Acute embolism and thrombosis of unspecified deep veins of left lower extremity: Secondary | ICD-10-CM | POA: Diagnosis not present

## 2016-02-18 DIAGNOSIS — E538 Deficiency of other specified B group vitamins: Secondary | ICD-10-CM | POA: Diagnosis not present

## 2016-02-18 DIAGNOSIS — I82402 Acute embolism and thrombosis of unspecified deep veins of left lower extremity: Secondary | ICD-10-CM | POA: Diagnosis not present

## 2016-05-22 DEATH — deceased

## 2016-11-15 IMAGING — US US EXTREM LOW VENOUS*L*
1 series · 13 of 24 positions shown · non-contrast
Comparison: None.

CLINICAL DATA: 89-year-old female with left lower extremity pain
and swelling.



[Series 1: us extrem low venous*left* · 13 of 35 slices shown]
[im 1/35]
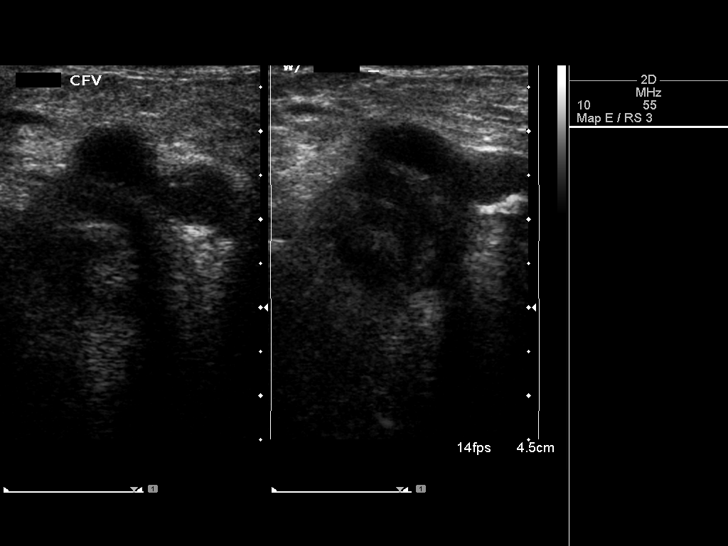
[im 3/35]
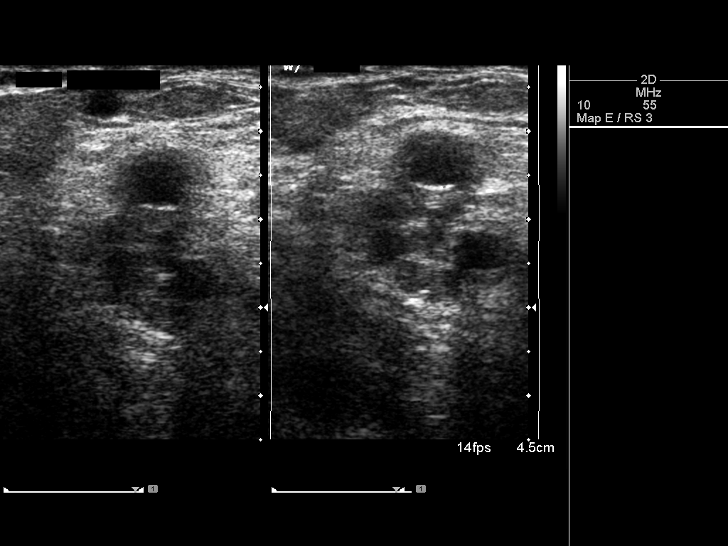
[im 6/35]
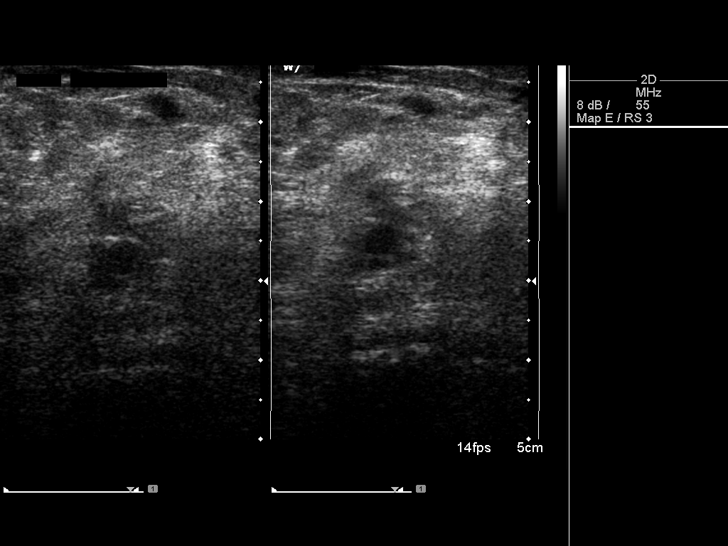
[im 9/35]
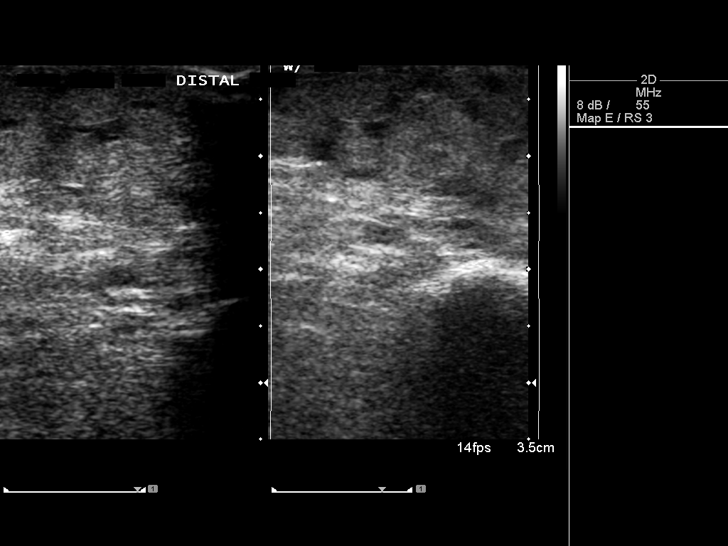
[im 12/35]
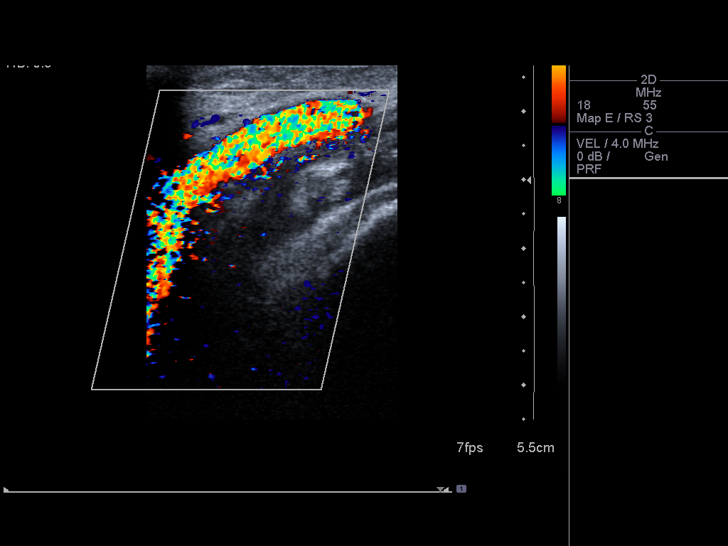
[im 15/35]
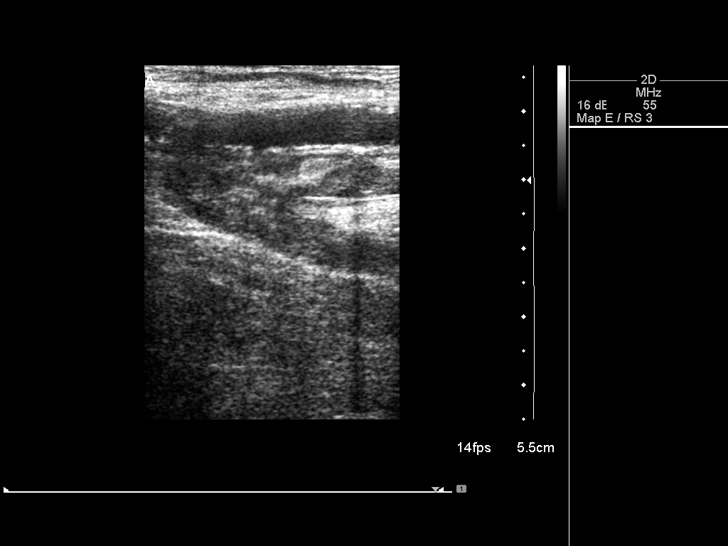
[im 18/35]
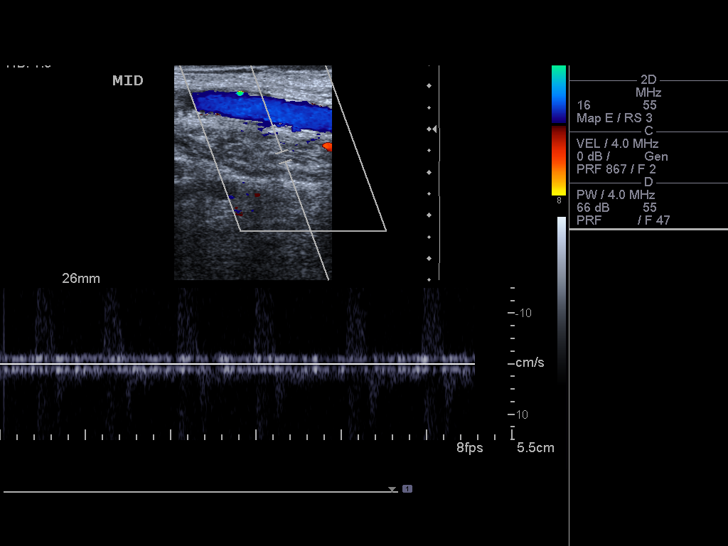
[im 20/35]
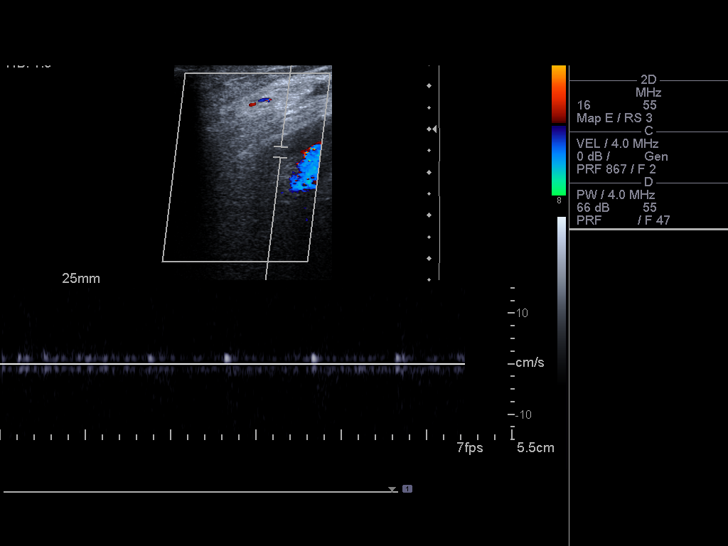
[im 23/35]
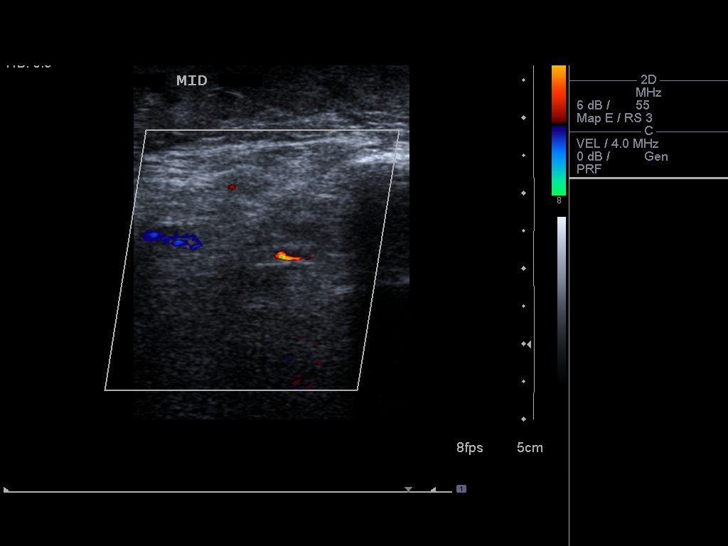
[im 26/35]
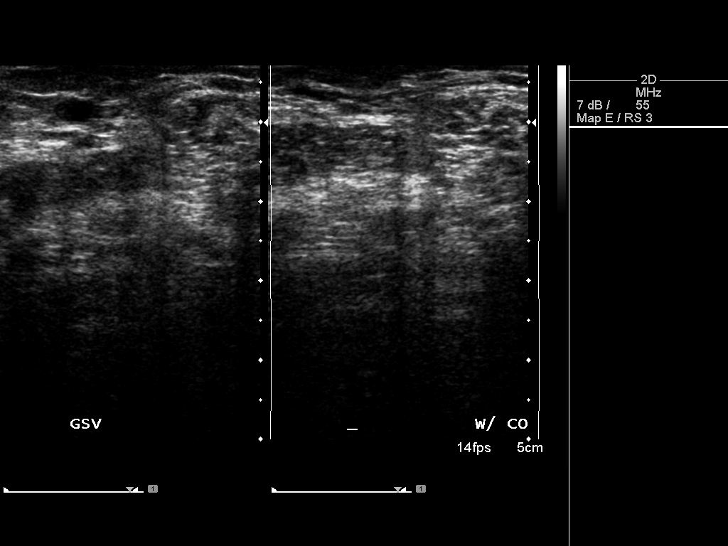
[im 29/35]
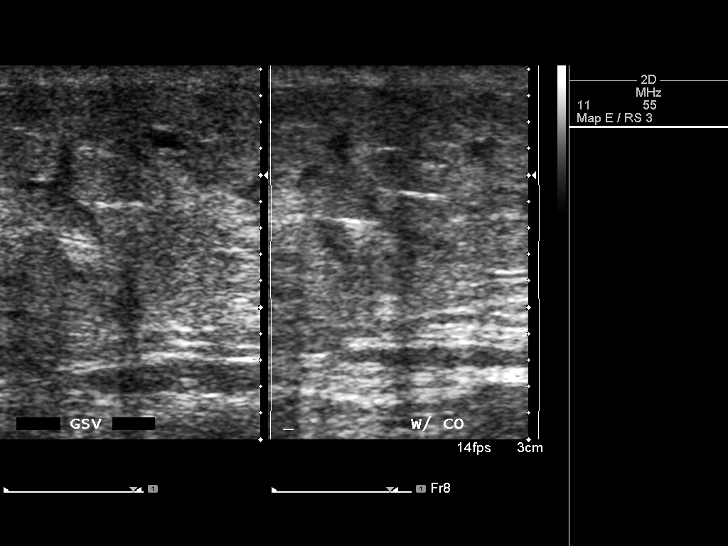
[im 32/35]
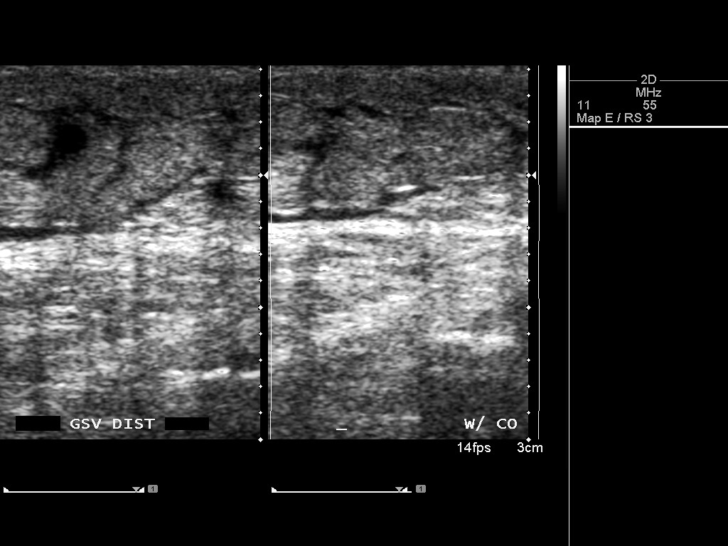
[im 35/35]
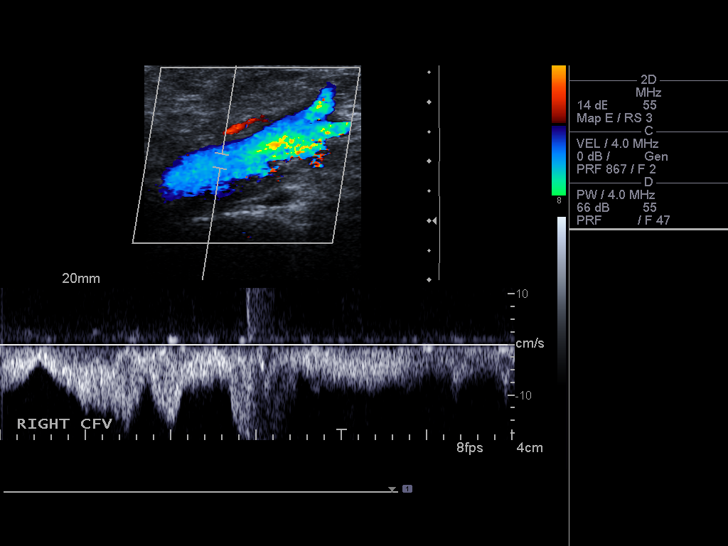

[13 of 24 positions shown; findings below may reference images not displayed]

FINDINGS: Occlusive thrombus is noted within the left common femoral, femoral,
popliteal and visualized calf veins. The greater saphenous vein is
patent.

The right common femoral vein is patent.
IMPRESSION: Occlusive left lower extremity DVT.

Critical Value/emergent results were called by telephone at the time
of interpretation on 01/02/2016 at [DATE] to Roberto, nurse with Dr.
Alvine Blanche, who verbally acknowledged these results.

## 2016-11-15 IMAGING — CR DG TIBIA/FIBULA 2V*L*
5 series · 5 of 5 positions shown · non-contrast
Comparison: None.

CLINICAL DATA: Fell several days ago with left leg pain

EXAM:
LEFT TIBIA AND FIBULA - 2 VIEW

[t tib/fib ap left (1 of 2)]
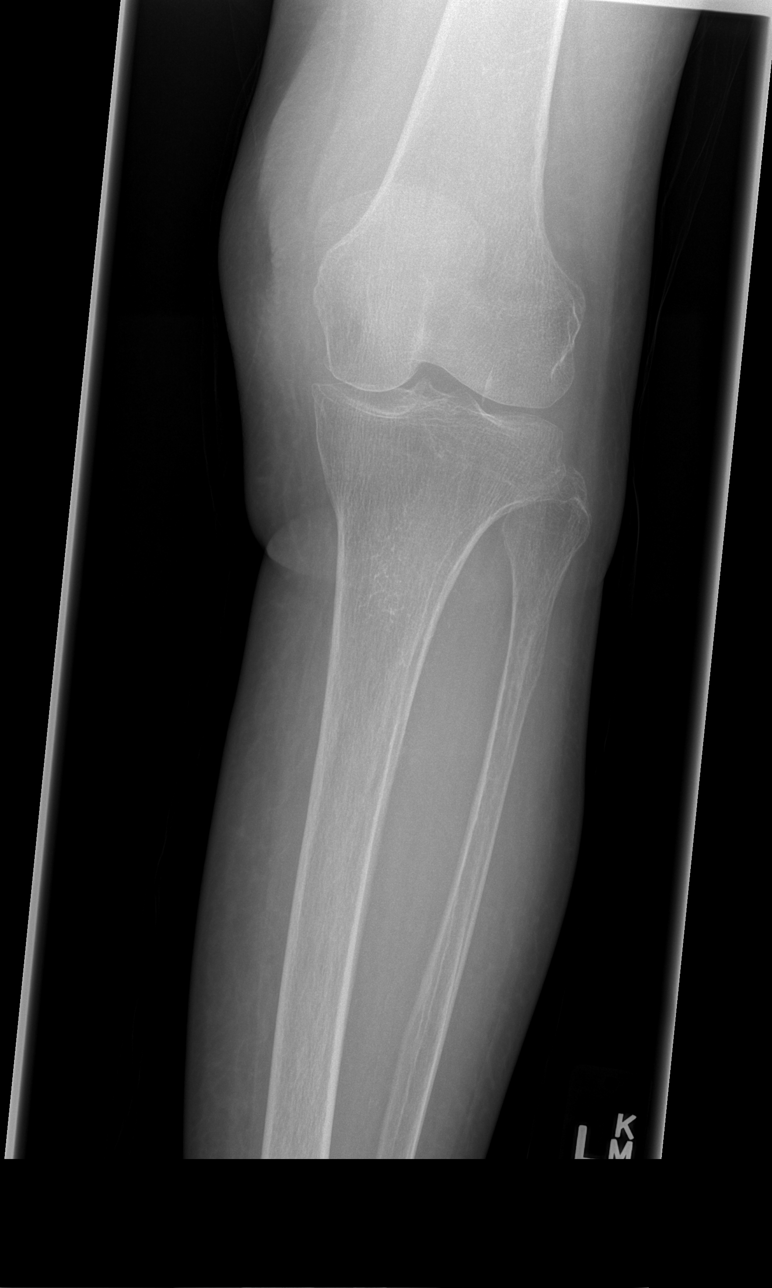

[t tib/fib ap left (2 of 2)]
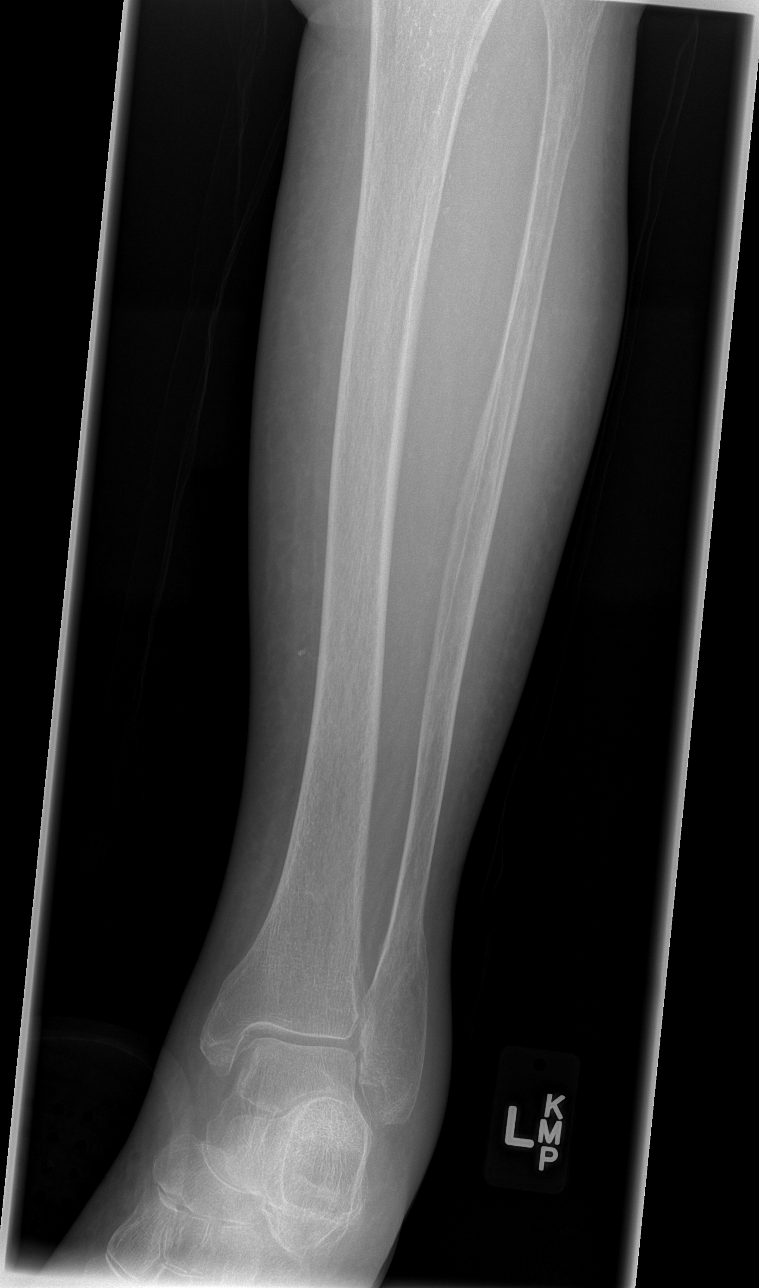

[t tib/fib lat left (1 of 3)]
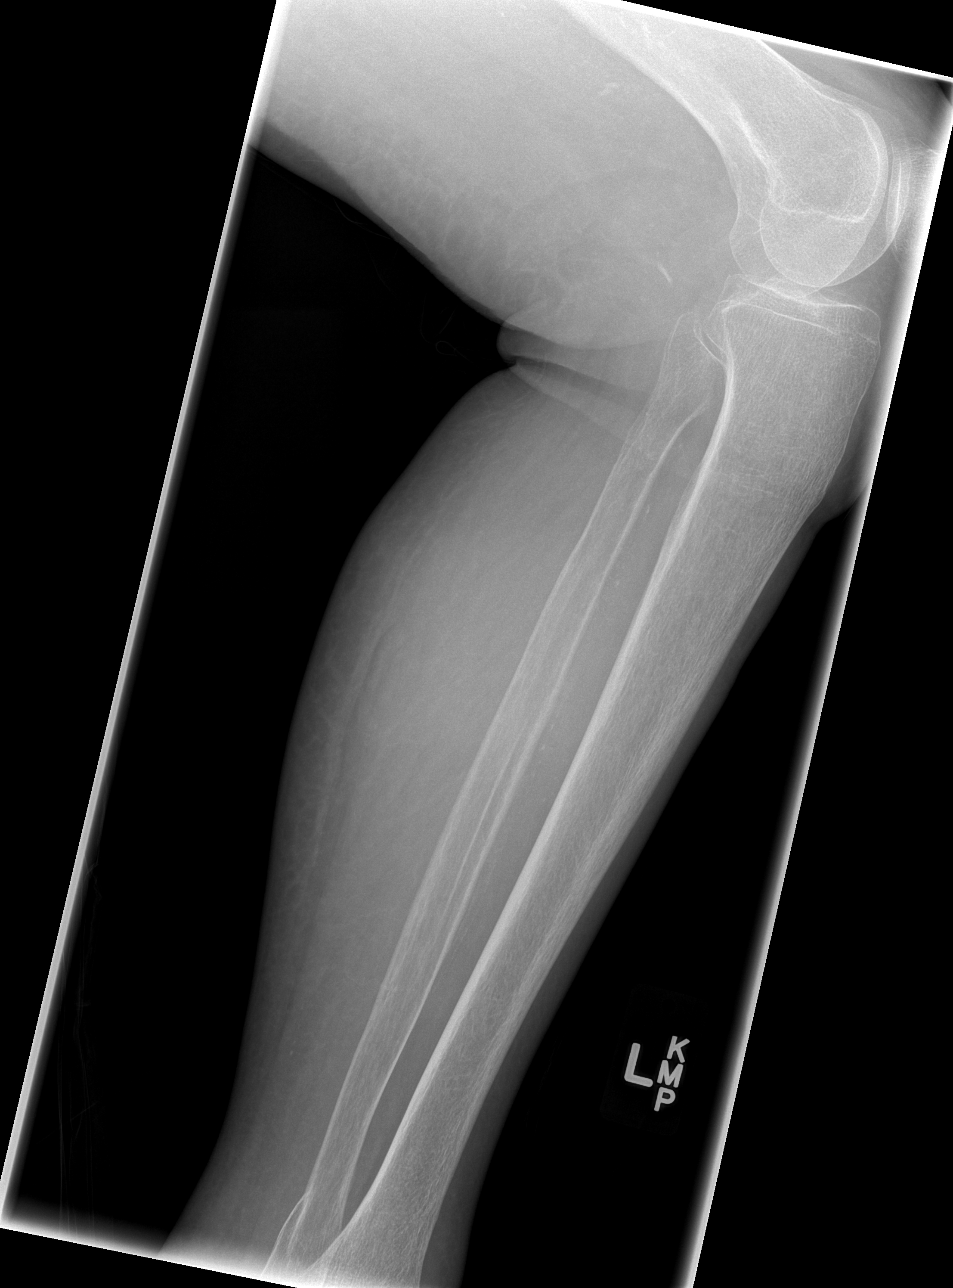

[t tib/fib lat left (2 of 3)]
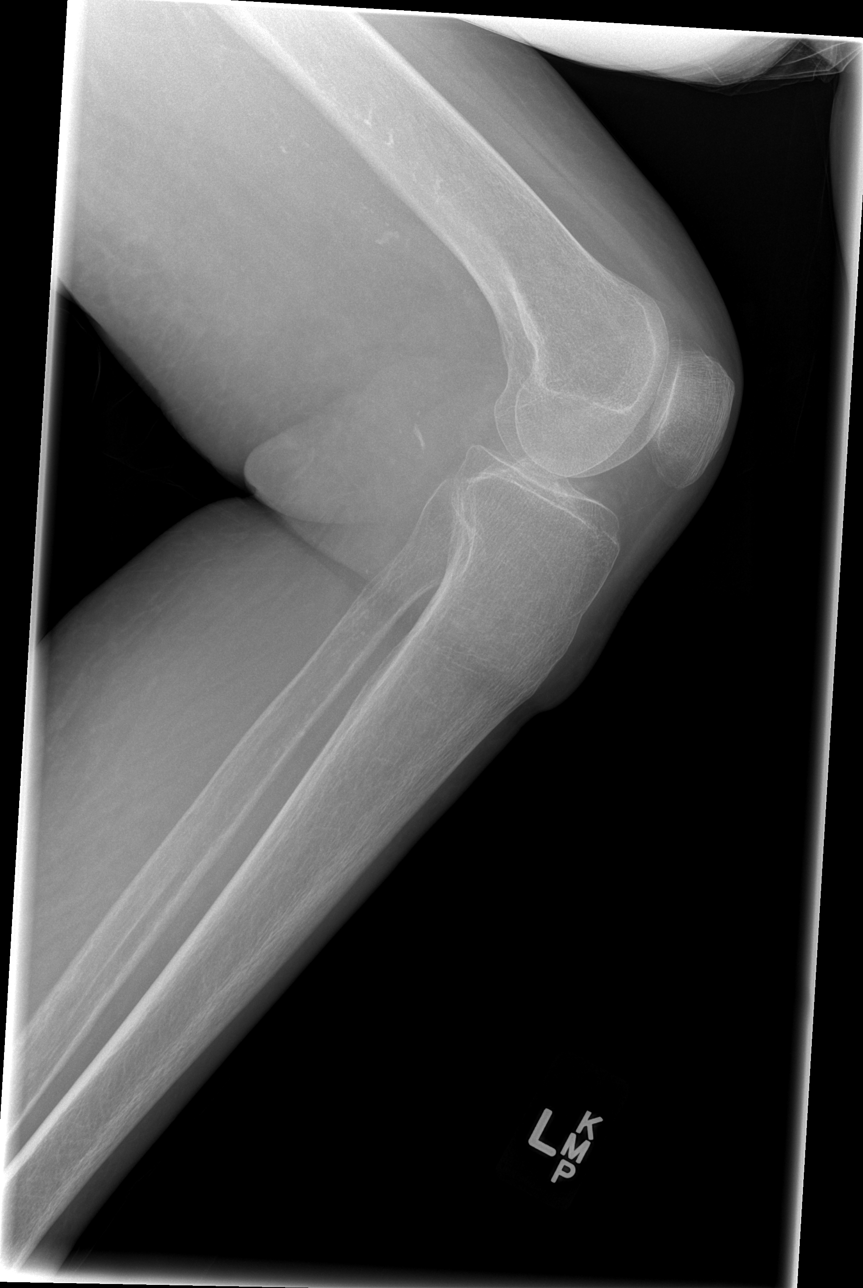

[t tib/fib lat left (3 of 3)]
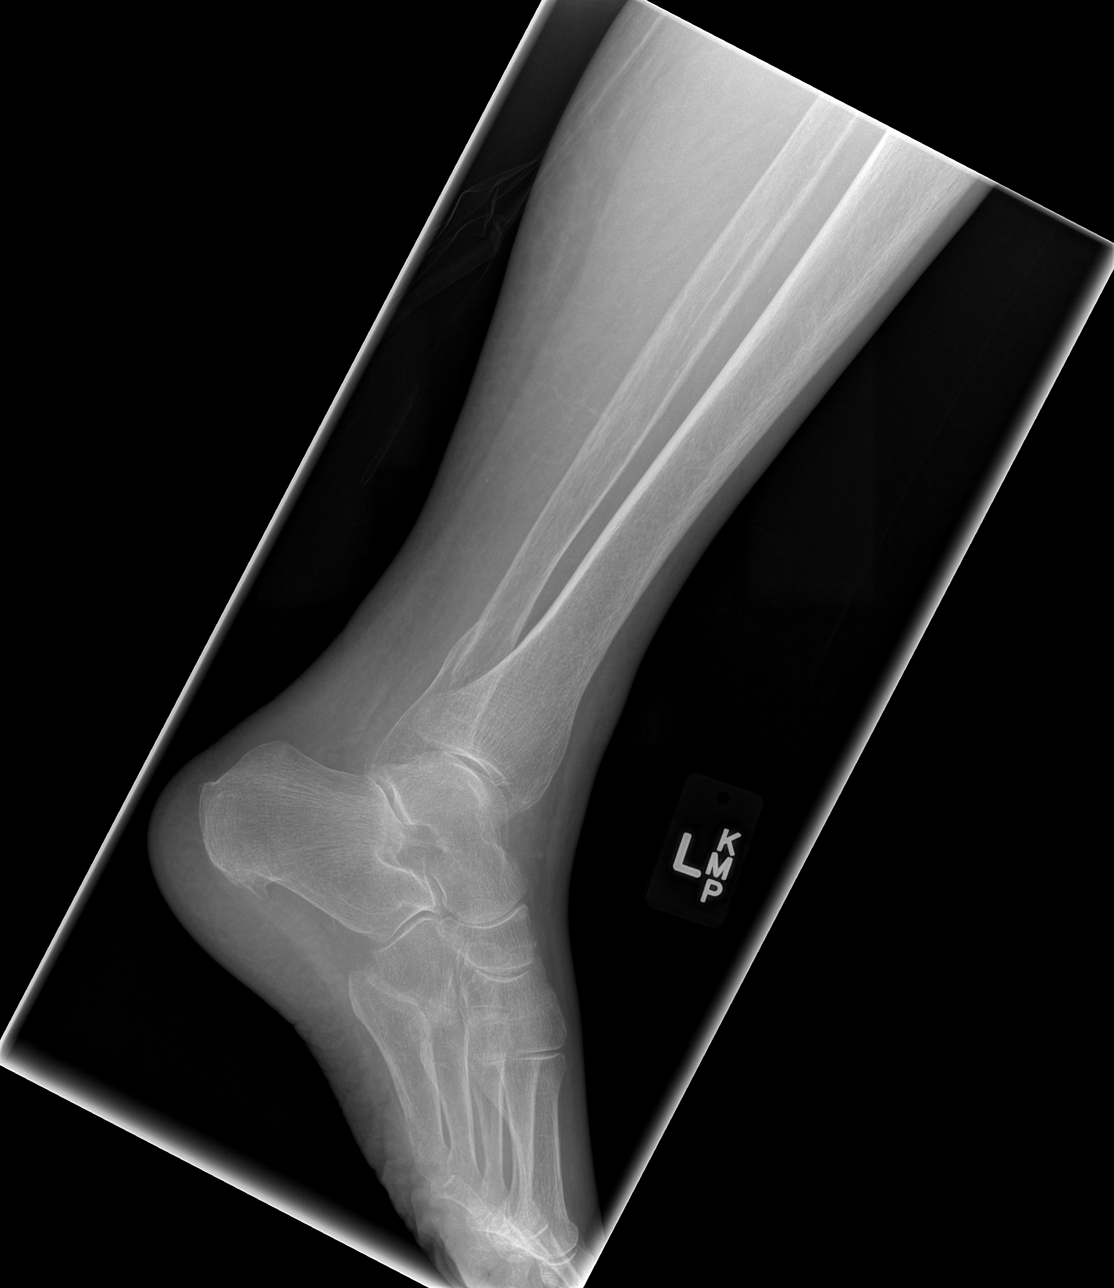

[5 of 5 positions shown; findings below may reference images not displayed]

FINDINGS: The bones are diffusely osteopenic. No acute fracture is seen. There
does appear to be an old healed fracture of the distal left fibula.
The knee joint and ankle joint are unremarkable.
IMPRESSION: No acute fracture.  Osteopenia.
